# Patient Record
Sex: Female | Born: 1985 | Race: White | Hispanic: No | Marital: Married | Smoking: Former smoker
Health system: Southern US, Community
[De-identification: ages and names within clinical notes are randomized; demographics above are authoritative.]

## PROBLEM LIST (undated history)

## (undated) DIAGNOSIS — K589 Irritable bowel syndrome without diarrhea: Secondary | ICD-10-CM

## (undated) DIAGNOSIS — F419 Anxiety disorder, unspecified: Secondary | ICD-10-CM

## (undated) DIAGNOSIS — F32A Depression, unspecified: Secondary | ICD-10-CM

## (undated) DIAGNOSIS — F329 Major depressive disorder, single episode, unspecified: Secondary | ICD-10-CM

## (undated) HISTORY — PX: DILATION AND CURETTAGE OF UTERUS: SHX78

## (undated) HISTORY — DX: Anxiety disorder, unspecified: F41.9

## (undated) HISTORY — DX: Irritable bowel syndrome, unspecified: K58.9

## (undated) HISTORY — PX: WISDOM TOOTH EXTRACTION: SHX21

## (undated) HISTORY — DX: Depression, unspecified: F32.A

## (undated) HISTORY — DX: Major depressive disorder, single episode, unspecified: F32.9

---
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
## 2006-01-16 ENCOUNTER — Emergency Department (HOSPITAL_COMMUNITY): Admission: EM | Admit: 2006-01-16 | Discharge: 2006-01-16 | Payer: Self-pay | Admitting: Emergency Medicine
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
## 2006-07-24 ENCOUNTER — Other Ambulatory Visit: Admission: RE | Admit: 2006-07-24 | Discharge: 2006-07-24 | Payer: Self-pay | Admitting: Family Medicine

## 2007-02-13 ENCOUNTER — Other Ambulatory Visit: Admission: RE | Admit: 2007-02-13 | Discharge: 2007-02-13 | Payer: Self-pay | Admitting: Family Medicine

## 2007-02-26 ENCOUNTER — Ambulatory Visit (HOSPITAL_COMMUNITY): Admission: RE | Admit: 2007-02-26 | Discharge: 2007-02-26 | Payer: Self-pay | Admitting: *Deleted

## 2007-02-26 IMAGING — US US OB TRANSVAGINAL MODIFY
1 series · 13 of 28 positions shown · non-contrast
Comparison: none

CLINICAL DATA: 20 year old with positive pregnancy test, bleeding.
OBSTETRICAL ULTRASOUND <14 WKS AND TRANSVAGINAL OB ULTRASOUND ? [DATE]:
TECHNIQUE: Both transabdominal and transvaginal ultrasound examinations were performed for complete evaluation of the gestation as well as the maternal uterus, adnexal regions, and pelvic cul-de-sac.

[Series 1: unknown · 0.23mm/px · 13 of 92 slices shown]
[im 4/92]
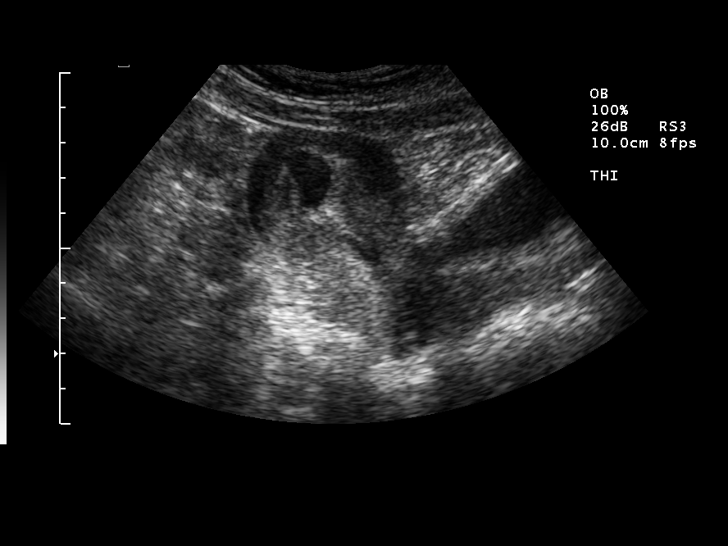
[im 11/92]
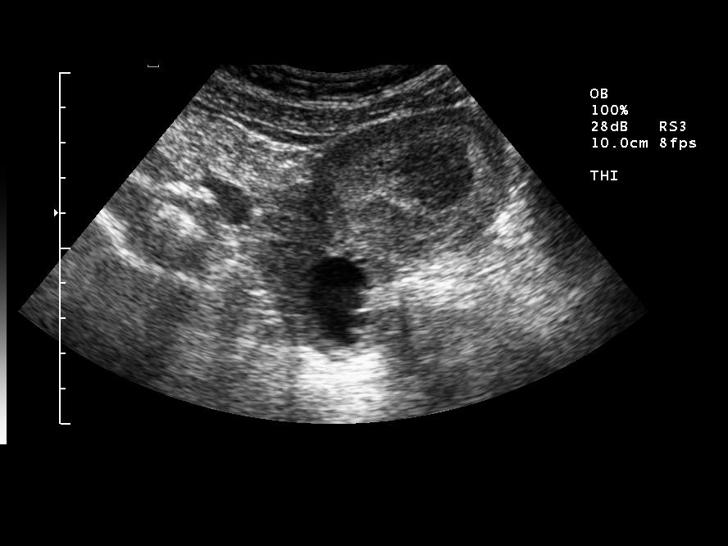
[im 17/92]
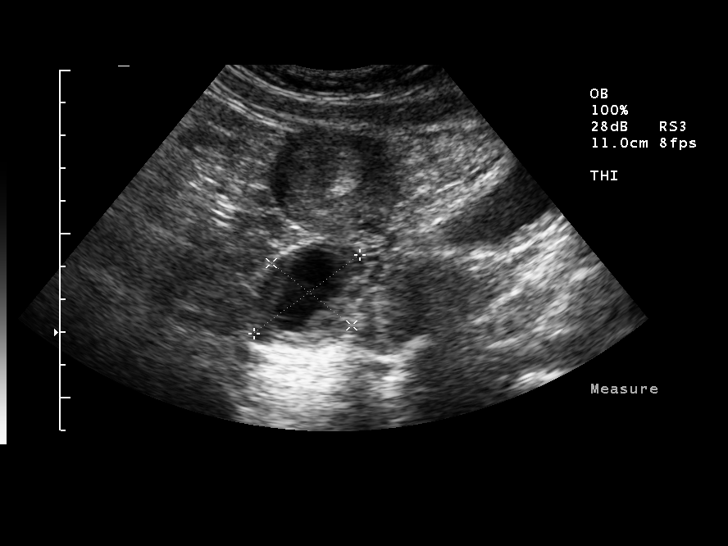
[im 24/92]
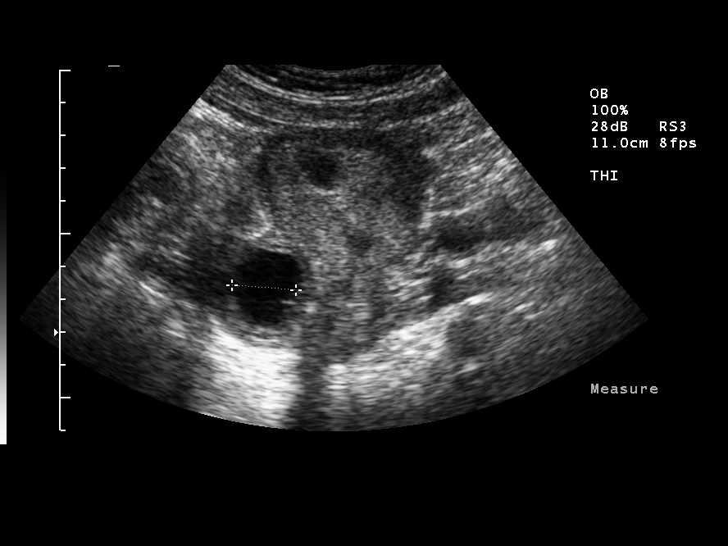
[im 31/92]
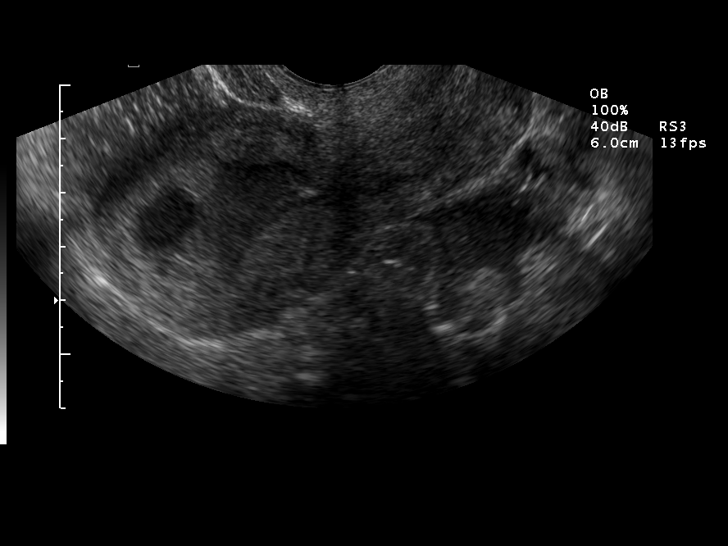
[im 38/92]
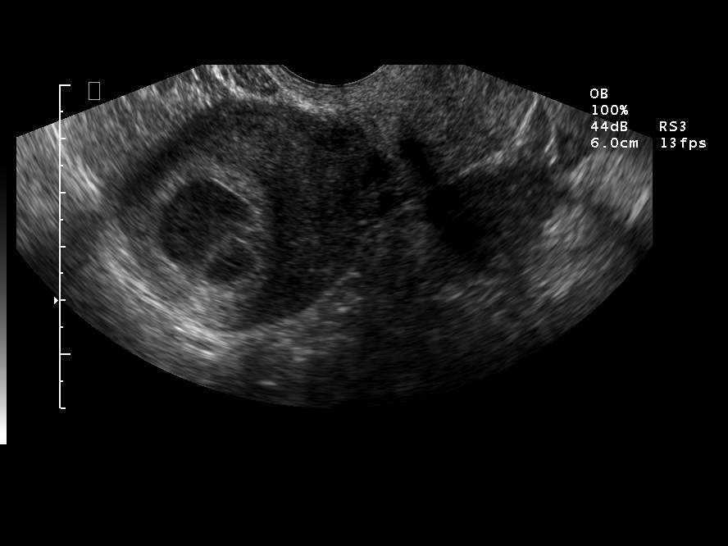
[im 48/92]
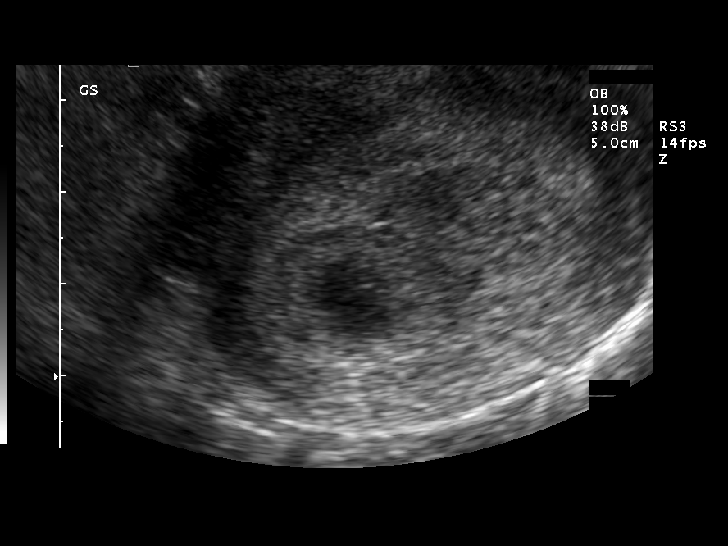
[im 54/92]
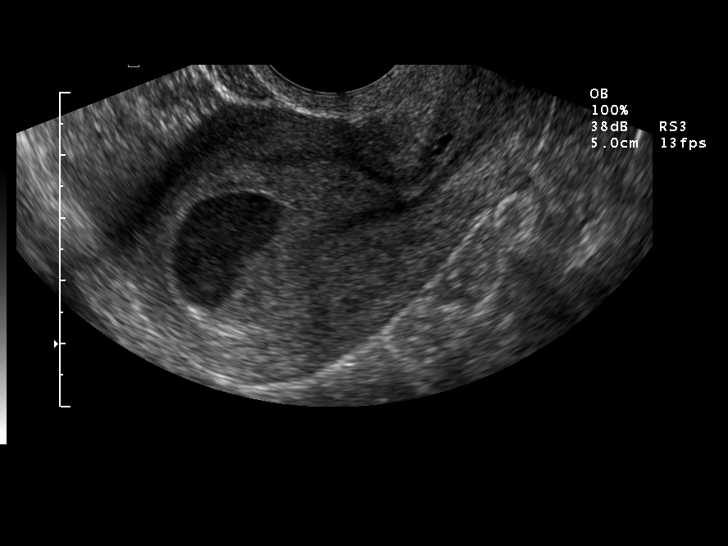
[im 61/92]
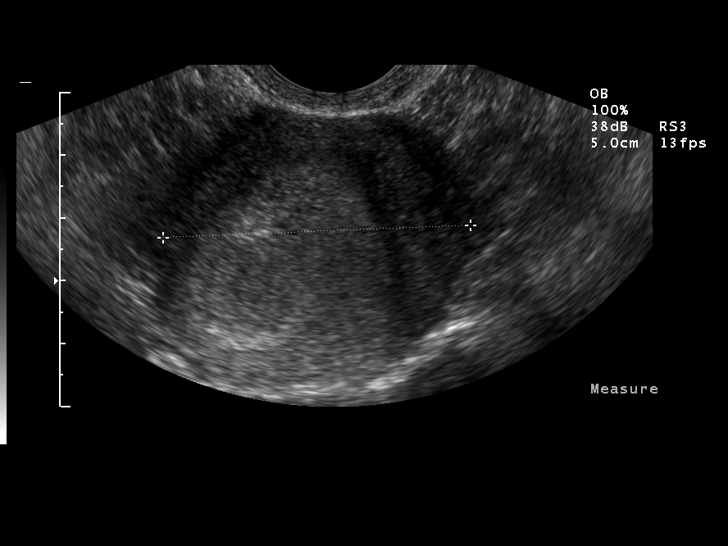
[im 68/92]
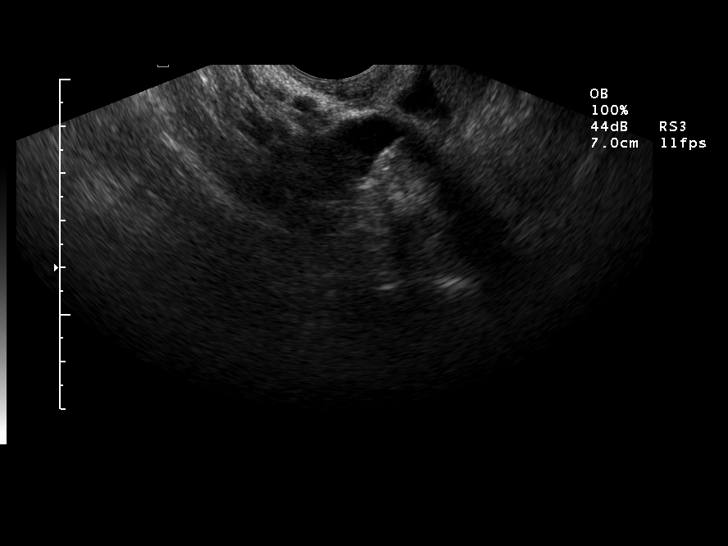
[im 75/92]
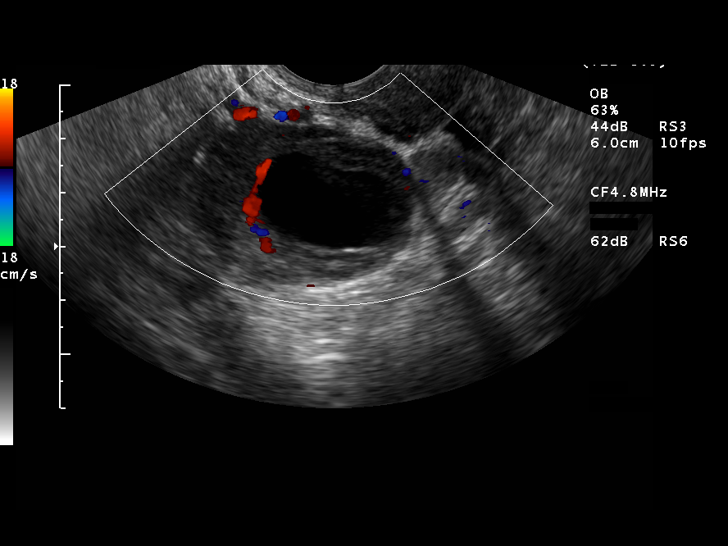
[im 81/92]
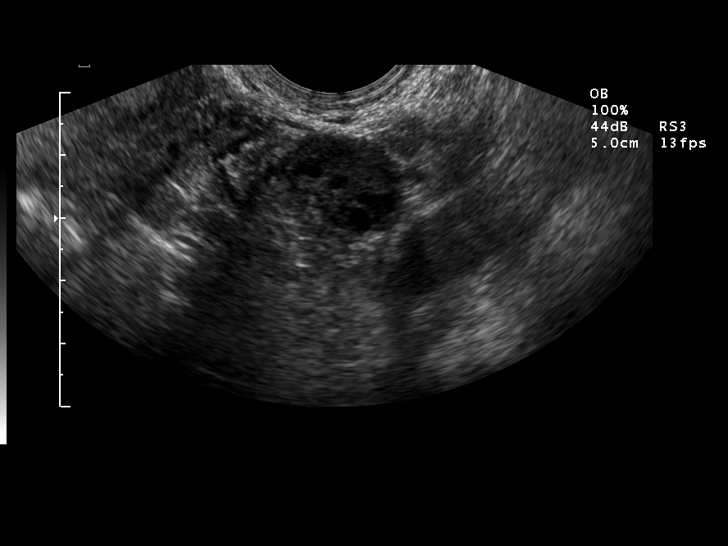
[im 88/92]
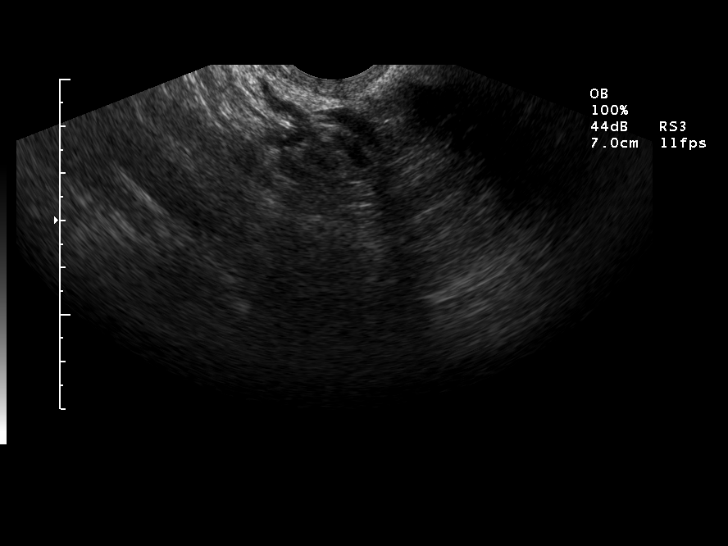

[13 of 28 positions shown; findings below may reference images not displayed]

FINDINGS: Within the uterus, there is a small saclike structure which does appear to contain a yolk sac.  This sac measures 6.6 mm, corresponding to an age of 5 weeks 3 days.  Adjacent to this sac, there is another rounded fluid collection which may represent a subchorionic hemorrhage though it is difficult to entirely exclude a second gestational sac.  I would suggest a follow-up ultrasound in 1-2 weeks to determine interval growth and document presence of embryonic pole with cardiac activity.  The right ovary contains a 2.8 x 1.6 x 1.6 cm corpus luteum cyst.  The left ovary has a normal appearance. There is a small amount of free pelvic fluid.
IMPRESSION: Intrauterine gestational sac with probable subchorionic hemorrhage.  It is not possible to entirely exclude two gestational sacs at this time and I would suggest close follow-up to document the presence of embryonic pole with heart rate.

## 2007-04-18 ENCOUNTER — Encounter (INDEPENDENT_AMBULATORY_CARE_PROVIDER_SITE_OTHER): Payer: Self-pay | Admitting: Obstetrics and Gynecology

## 2007-04-18 ENCOUNTER — Ambulatory Visit (HOSPITAL_COMMUNITY): Admission: RE | Admit: 2007-04-18 | Discharge: 2007-04-18 | Payer: Self-pay | Admitting: Obstetrics and Gynecology

## 2007-05-02 ENCOUNTER — Ambulatory Visit (HOSPITAL_COMMUNITY): Admission: AD | Admit: 2007-05-02 | Discharge: 2007-05-02 | Payer: Self-pay | Admitting: Obstetrics and Gynecology

## 2007-05-02 ENCOUNTER — Encounter (INDEPENDENT_AMBULATORY_CARE_PROVIDER_SITE_OTHER): Payer: Self-pay | Admitting: Obstetrics and Gynecology

## 2008-11-25 ENCOUNTER — Other Ambulatory Visit: Admission: RE | Admit: 2008-11-25 | Discharge: 2008-11-25 | Payer: Self-pay | Admitting: Family Medicine

## 2009-06-24 ENCOUNTER — Ambulatory Visit (HOSPITAL_COMMUNITY): Admission: RE | Admit: 2009-06-24 | Discharge: 2009-06-24 | Payer: Self-pay | Admitting: Obstetrics and Gynecology

## 2009-06-24 ENCOUNTER — Encounter (INDEPENDENT_AMBULATORY_CARE_PROVIDER_SITE_OTHER): Payer: Self-pay | Admitting: Obstetrics and Gynecology

## 2010-12-31 LAB — CBC
HCT: 41 % (ref 36.0–46.0)
MCHC: 34 g/dL (ref 30.0–36.0)
MCV: 88.5 fL (ref 78.0–100.0)
RDW: 13.2 % (ref 11.5–15.5)
WBC: 6.9 10*3/uL (ref 4.0–10.5)

## 2010-12-31 LAB — ABO/RH: ABO/RH(D): O POS

## 2011-02-08 NOTE — H&P (Signed)
Leslie Small, Leslie Small           ACCOUNT NO.:  [id number]   MEDICAL RECORD NO.:  [id number]          PATIENT TYPE:  AMB   LOCATION:  [facility]                           [facility]ILITY[facility]  [facility]   PHYSICIAN:  Charles A. Delcambre, MDDATE OF BIRTH:  1986-08-28   DATE OF ADMISSION:  DATE OF DISCHARGE:                              HISTORY & PHYSICAL   CHIEF COMPLAINT:  Missed abortion, 8 weeks and 5 days with fetal demise.   HISTORY OF PRESENT ILLNESS:  A 25 year old gravida 1, para 0-0-0-0, [facility]  October 31, 2007, with viable ultrasound noted on March 19, 2007, at 7  weeks and 5 days.  Fetal heart rate was 170.  She comes in today  complaining of cramping for one week.  Upon my recommendation, she  should be 12 weeks today, and I did not get a fetal heart rate with the  Doppler.  She underwent transvaginal ultrasound which showed a fetal  demise at 8 weeks and 5 days.  She was given options of waiting one more  week for spontaneous miscarriage versus Cytotec induced miscarriage  versus suction dilation curettage, dilation evacuation for termination  of the obvious inevitable abortion.  She elected this route for the D&E  and accepted risk of infection, bleeding, uterine perforation, retained  products, second D&C, blood products including hepatitis and HIV, bowel  and bladder damage.  All questions were answered.  She gives informed  consent.  Blood type is O positive   PAST MEDICAL HISTORY:  Depression, suicide attempt as a child.  Started  on Zoloft by me approximately two weeks ago.   MEDICATIONS:  1. Prenatal vitamins.  2. Zoloft 50 mg per day.   PAST SURGICAL HISTORY:  Oral surgery.   ALLERGIES:  No known drug allergies.   SOCIAL HISTORY:  No tobacco, ethanol or drug use.  She is monogamous  with her boyfriend.  No alcohol use.   FAMILY HISTORY:  Maternal thyroid disease, paternal grandfather chronic  hypertension.  Paternal grandmother breast cancer.  Maternal grandfather  lung cancer.   Otherwise, no major illnesses.   REVIEW OF SYSTEMS:  She does have cramping.  No vaginal bleeding.  No  shortness of breath, wheezing, diarrhea, constipation, bleeding per  rectum.  Headache, nausea or vomiting.   PHYSICAL EXAMINATION:  GENERAL:  Alert and oriented x3.  VITAL SIGNS:  Weight 136 pounds, blood pressure 100/60, respirations 18,  pulse 80.  HEENT:  Grossly within normal limits.  NECK:  Supple without thyromegaly or adenopathy.  LUNGS:  Clear bilaterally.  HEART:  Regular rate and rhythm without murmur.  BREASTS:  No masses, tenderness, discharge, skin or nipple changes  bilaterally.  ABDOMEN:  Flat without tenderness.  No hepatosplenomegaly.  PELVIC:  Normal external female genitalia.  She did complain of vaginal  discharge.  Wet prep was done today.  Cervix by digital exam is closed .  Uterus is small and anteverted consistent with 8 weeks size.  Ultrasound  is noted above.  Otherwise, adnexa with ovaries nontender and palpable  bilaterally.  Uterus was nontender.  RECTAL:  Not done.  Anus, peroneal body appear  normal.   ASSESSMENT:  Intrauterine pregnancy fetal demise at 8 weeks 5 days.  Missed AB at 12 weeks now for dilation evacuation.   PLAN:  N.p.o. past midnight.  I will have this on scheduled for tomorrow  at 10:45.  All questions were answered.  She gives informed consent.  Preop CBC.  We will plan to proceed as outlined.      Charles A. Sydnee Cabal, MD  Electronically Signed     CAD/MEDQ  D:  04/17/2007  T:  04/17/2007  Job:  [id number]

## 2011-02-08 NOTE — Op Note (Signed)
[name], Leslie Small           ACCOUNT NO.:  [id number]   MEDICAL RECORD NO.:  [id number]          PATIENT TYPE:  AMB   LOCATION:  [facility]                           [facility]  [facility]   PHYSICIAN:  Charles A. Delcambre, MDDATE OF BIRTH:  1985/10/03   DATE OF PROCEDURE:  04/18/2007  DATE OF DISCHARGE:                               OPERATIVE REPORT   PREOPERATIVE DIAGNOSIS:  Eight-week, five-week fetal demise, twelve-week  pregnancy, missed abortion.   POSTOPERATIVE DIAGNOSIS:  Eight-week, five-week fetal demise, twelve-  week pregnancy, missed abortion.   PROCEDURE PERFORMED:  1. Dilation and evacuation.  2. Paracervical block.   SURGEON:  Charles A. Delcambre, MD   ASSISTANT:  None.   COMPLICATIONS:  None.   ESTIMATED BLOOD LOSS:  Approximately 50 mL.   ANESTHESIA:  Monitored anesthesia care with intravenous sedation.   FINDINGS:  An eight-week pregnancy, products of conception large amount  to pathology.   COUNTS:  Sponge and needle count correct x2.   DESCRIPTION OF PROCEDURE:  The patient was taken to the operating room  and placed in the supine position.  She was sedated and placed in the  dorsal lithotomy position.  Sterile prep and drape was undertaken.  Tenaculum was used on the anterior cervix, weighted speculum in the  vagina.  Paracervical block with 0.25% plain Marcaine, 19 mL  approximately divided equally at 4 o'clock and 8 o'clock.  There was no  evidence of intravascular injection.  Hagar dilators up to Hagar 27 were  used to dilate the cervix.  There was no evidence of perforation.  A 9-  mm suction curette at 50 was used to accomplish three aspirations with a  curved curette, a moderate amount of tissue each time, fourth pass with  no tissue.  There was no evidence of perforation.  A Banjo curette was  used to circumferentially curette the cavity.  No further tissue was  obtained.  Final pass with the suction curette was done.  No evidence of  perforation.   No further tissue noted.  She was given one dose of  Methergine 0.2 mg IM to help with  bleeding with excellent response and 20 units of pitocin in the IV  fluid, approximately 600 mL.  She tolerated the procedure well.  Tenaculum site was not bleeding.  All instruments were removed.  She was  taken to recovery with anesthesia in attendance.      Charles A. Sydnee Cabal, MD  Electronically Signed     CAD/MEDQ  D:  04/18/2007  T:  04/18/2007  Job:  [id number]

## 2011-02-08 NOTE — Op Note (Signed)
Leslie Small, Leslie Small           ACCOUNT NO.:  [id number]   MEDICAL RECORD NO.:  [id number]          PATIENT TYPE:  AMB   LOCATION:  [facility]                          [facility]  [facility]   PHYSICIAN:  Charles A. Delcambre, MDDATE OF BIRTH:  Aug 29, 1986   DATE OF PROCEDURE:  05/02/2007  DATE OF DISCHARGE:                               OPERATIVE REPORT   PREOPERATIVE DIAGNOSIS:  Retained products of conception after dilation,  evacuation for an 8-week 5-day demise noted to be found as a missed AB  at 12 weeks approximately 2 weeks ago.   POSTOPERATIVE DIAGNOSIS:  Retained products of conception after  dilation, evacuation for an 8-week 5-day demise noted to be found as a  missed AB at 12 weeks approximately 2 weeks ago.   PROCEDURE:  1. Dilation and evacuation/dilate dilation and curettage.  2. Paracervical block.   SURGEON:  Charles A. Delcambre, MD   ASSISTANT:  None.   COMPLICATIONS:  None.   BLOOD LOSS:  50 mL.   SPECIMENS:  Products of conception moderate amount to pathology.   ANESTHESIA:  Monitored anesthesia care with IV sedation.   INSTRUMENT SPONGE NEEDLE COUNT:  Correct x2.   DESCRIPTION OF PROCEDURE:  The patient was taken to the operating room,  placed supine position and IV sedation was accomplished.  She was then  placed in dorsal lithotomy position in universal stirrups and sterilely  prepped and draped.  Cervix was viewed and some products were viewed  trying to come out as she had been cramping quite a bit today.  Ring  forceps were used to explore, sound was to 8 cm.  There was no evidence  of perforation during the entire case.  With ring forceps moderate  amount of tissue was removed and generalized curettage with the banjo  curette was done.  Small amount of tissue further with 50 cm mercury 8  mm suction curved curette was passed, the first time yielding a small  amount of tissue, second time yielding no further tissue.  She was once  again explored with  the polyp forceps and the banjo curette, one final  pass with the suction curette, no further tissue with any of these  passes and procedure was then terminated.  Tenaculum was removed.  I  should note that before the procedure began, paracervical block was  placed with 0.25% plain Marcaine divided equally at 4 and 8  o'clock.  There was no evidence of intravascular injection and to  prophylax against bleeding as she had been bleeding somewhat heavily  today, 800 mcg of Cytotec were placed per rectum.  The patient was  awaken and taken to recovery with physician in attendance and tolerated  procedure well.      Charles A. Sydnee Cabal, MD  Electronically Signed     CAD/MEDQ  D:  05/02/2007  T:  05/03/2007  Job:  [id number]

## 2011-07-11 LAB — CBC
HCT: 38.1
Hemoglobin: 12.1
Hemoglobin: 12.8
MCHC: 33.2
MCHC: 33.5
RBC: 4.44
RDW: 16.8 — ABNORMAL HIGH
WBC: 5.7

## 2014-08-13 ENCOUNTER — Encounter (HOSPITAL_BASED_OUTPATIENT_CLINIC_OR_DEPARTMENT_OTHER): Payer: Self-pay

## 2014-08-13 ENCOUNTER — Emergency Department (HOSPITAL_BASED_OUTPATIENT_CLINIC_OR_DEPARTMENT_OTHER): Payer: Self-pay

## 2014-08-13 ENCOUNTER — Emergency Department (HOSPITAL_BASED_OUTPATIENT_CLINIC_OR_DEPARTMENT_OTHER)
Admission: EM | Admit: 2014-08-13 | Discharge: 2014-08-13 | Disposition: A | Discharge status: Home / Self Care | Payer: Self-pay | Attending: Emergency Medicine | Admitting: Emergency Medicine

## 2014-08-13 DIAGNOSIS — M791 Myalgia: Secondary | ICD-10-CM | POA: Insufficient documentation

## 2014-08-13 DIAGNOSIS — R21 Rash and other nonspecific skin eruption: Secondary | ICD-10-CM | POA: Insufficient documentation

## 2014-08-13 DIAGNOSIS — Z720 Tobacco use: Secondary | ICD-10-CM | POA: Insufficient documentation

## 2014-08-13 DIAGNOSIS — R05 Cough: Secondary | ICD-10-CM

## 2014-08-13 DIAGNOSIS — J181 Lobar pneumonia, unspecified organism: Secondary | ICD-10-CM

## 2014-08-13 DIAGNOSIS — J189 Pneumonia, unspecified organism: Secondary | ICD-10-CM | POA: Insufficient documentation

## 2014-08-13 DIAGNOSIS — R059 Cough, unspecified: Secondary | ICD-10-CM

## 2014-08-13 DIAGNOSIS — R000 Tachycardia, unspecified: Secondary | ICD-10-CM | POA: Insufficient documentation

## 2014-08-13 IMAGING — CR DG CHEST 2V
2 series · 2 of 2 positions shown · non-contrast
Comparison: None.

CLINICAL DATA: Cough, chest congestion, and shortness of breath.

EXAM:
CHEST  2 VIEW

[w chest pa]
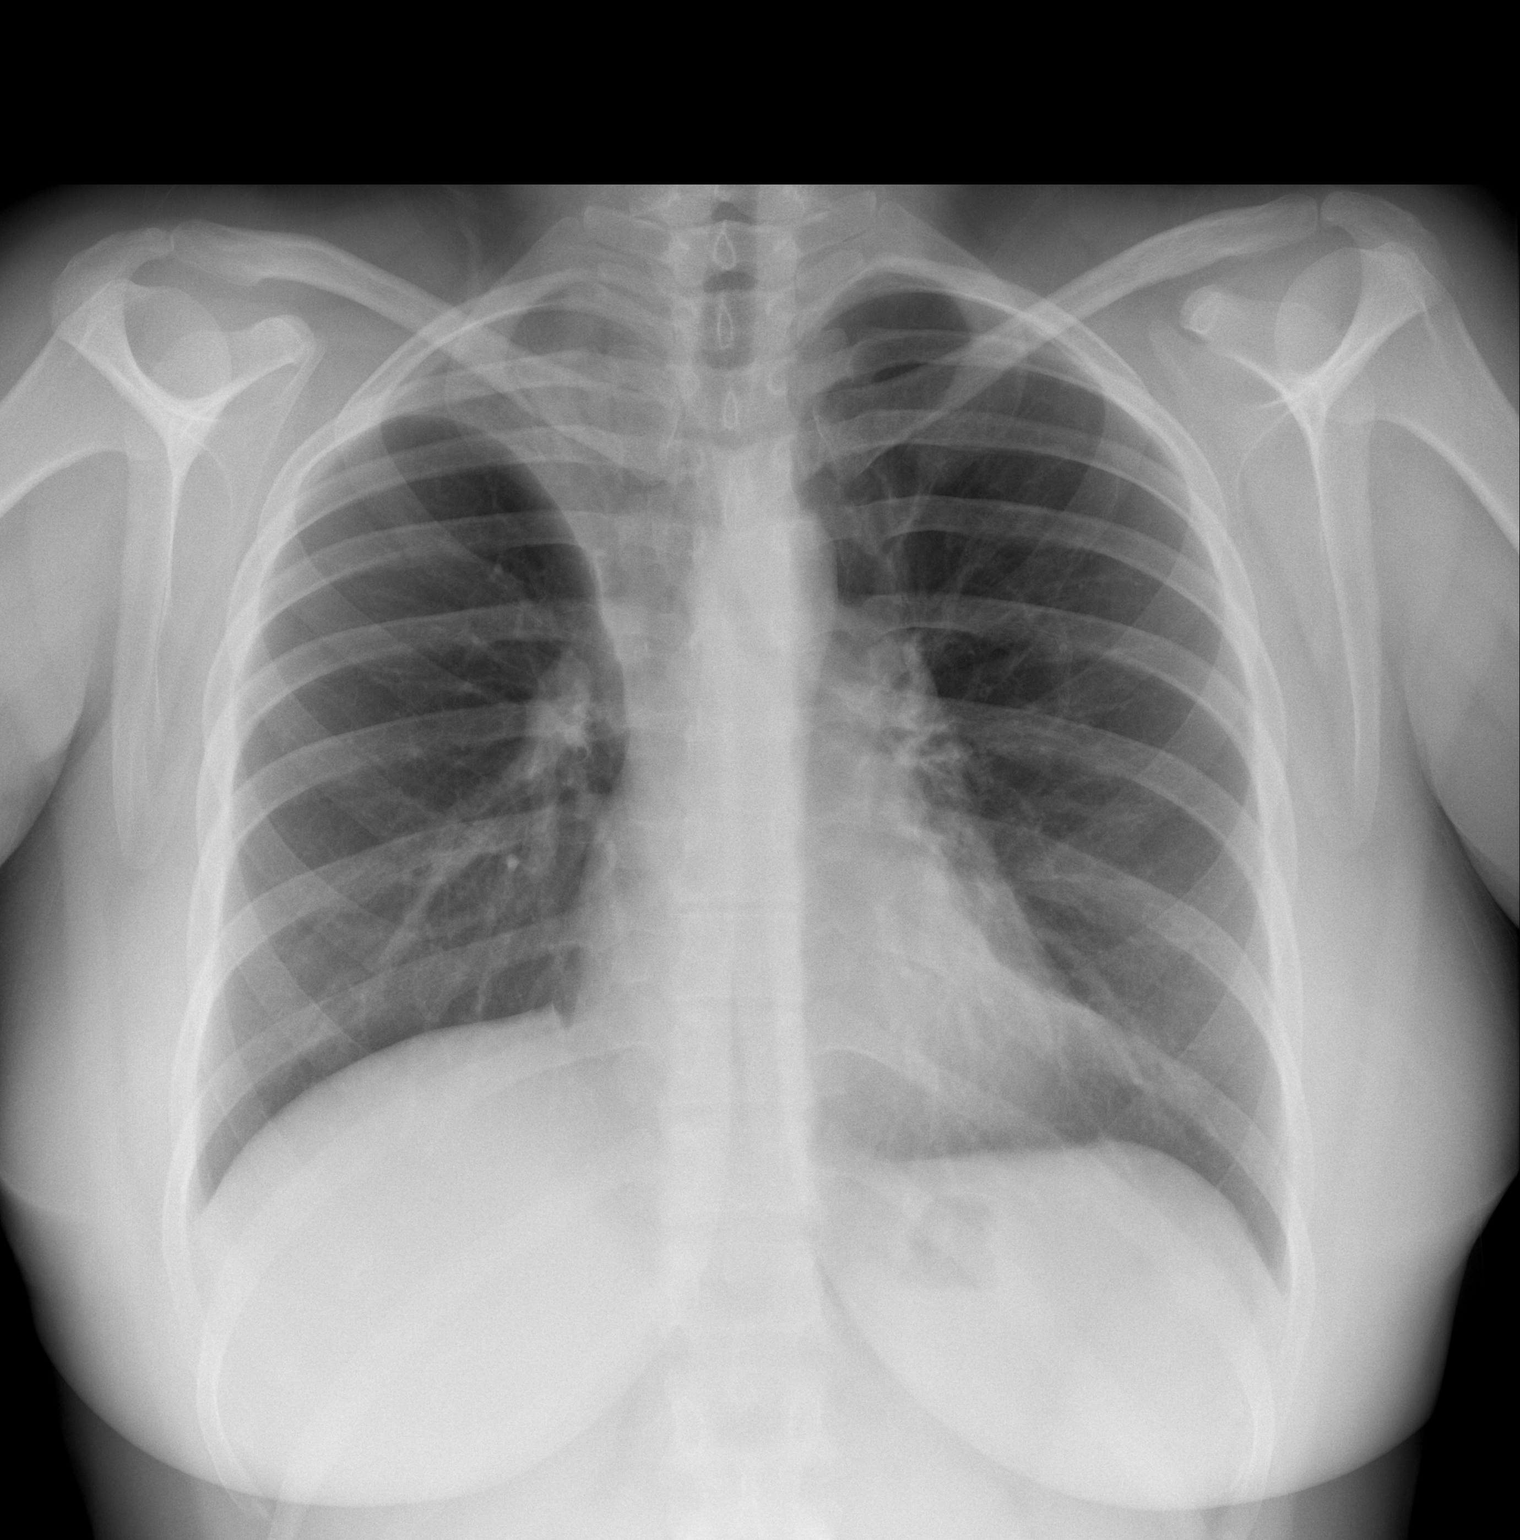

[w chest lat]
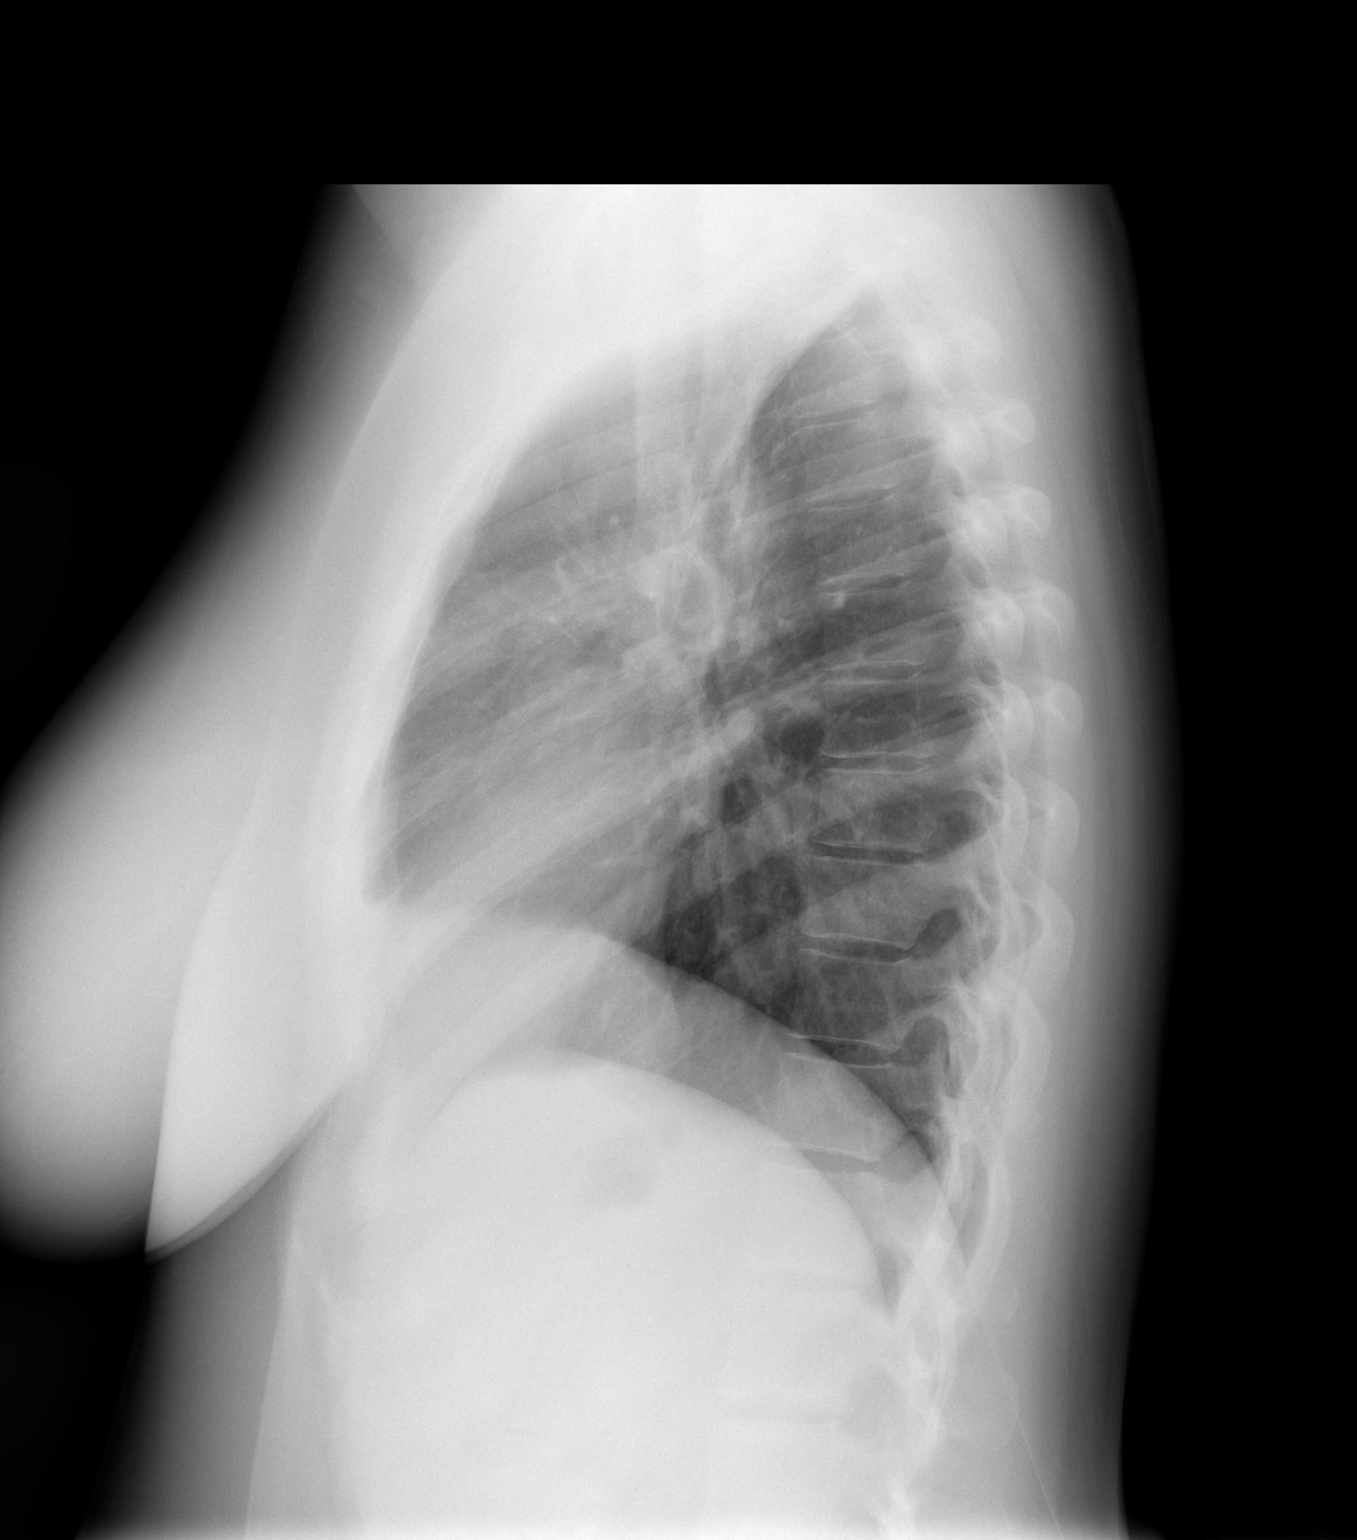

[2 of 2 positions shown; findings below may reference images not displayed]

FINDINGS: There is complete atelectasis of the right upper lobe. This is
consistent with bronchial obstruction. The rest of the right lung
appears normal. Left lung is clear. Heart size and vascularity are
normal. No effusions. No osseous abnormality.
IMPRESSION: Complete atelectasis of the right upper lobe suggesting right upper
lobe bronchial obstruction.

## 2014-08-13 MED ORDER — IPRATROPIUM-ALBUTEROL 0.5-2.5 (3) MG/3ML IN SOLN
3.0000 mL | RESPIRATORY_TRACT | Status: DC
  Administered 2014-08-13: 3 mL via RESPIRATORY_TRACT
  Filled 2014-08-13: qty 3

## 2014-08-13 MED ORDER — PREDNISONE 20 MG PO TABS
40.0000 mg | ORAL_TABLET | Freq: Once | ORAL | Status: AC
  Administered 2014-08-13: 40 mg via ORAL
  Filled 2014-08-13: qty 2

## 2014-08-13 MED ORDER — ALBUTEROL SULFATE HFA 108 (90 BASE) MCG/ACT IN AERS
2.0000 | INHALATION_SPRAY | RESPIRATORY_TRACT | Status: DC | PRN
  Administered 2014-08-13: 2 via RESPIRATORY_TRACT
  Filled 2014-08-13: qty 6.7

## 2014-08-13 MED ORDER — PREDNISONE 10 MG PO TABS: ORAL_TABLET | ORAL | Status: DC

## 2014-08-13 MED ORDER — AZITHROMYCIN 250 MG PO TABS: ORAL_TABLET | ORAL | Status: DC

## 2014-08-13 NOTE — ED Notes (Signed)
Patient taking OTC meds, Nyquil & tylenol, but no relief

## 2014-08-13 NOTE — ED Notes (Signed)
C/o head/chest congestion with prod cough x 3 days

## 2014-08-13 NOTE — Discharge Instructions (Signed)
Take robitussin for your cough in addition to the other medications. You will need to follow up in 2 weeks for recheck and then scheduled for a follow up x-ray. Return here sooner for worsening symptoms   Cool Mist Vaporizers Vaporizers may help relieve the symptoms of a cough and cold. They add moisture to the air, which helps mucus to become thinner and less sticky. This makes it easier to breathe and cough up secretions. Cool mist vaporizers do not cause serious burns like hot mist vaporizers, which may also be called steamers or humidifiers. Vaporizers have not been proven to help with colds. You should not use a vaporizer if you are allergic to mold. HOME CARE INSTRUCTIONS  Follow the package instructions for the vaporizer.  Do not use anything other than distilled water in the vaporizer.  Do not run the vaporizer all of the time. This can cause mold or bacteria to grow in the vaporizer.  Clean the vaporizer after each time it is used.  Clean and dry the vaporizer well before storing it.  Stop using the vaporizer if worsening respiratory symptoms develop. Document Released: 06/09/2004 Document Revised: 09/17/2013 Document Reviewed: 01/30/2013 [facility] Patient Information 2015 [employer], [place]. This information is not intended to replace advice given to you by your health care provider. Make sure you discuss any questions you have with your health care provider.

## 2014-08-13 NOTE — ED Provider Notes (Signed)
CSN: [id number]     Arrival date & time 08/13/14  1334 History   First MD Initiated Contact with Patient 08/13/14 1517     Chief Complaint  Patient presents with  . URI     (Consider location/radiation/quality/duration/timing/severity/associated sxs/prior Treatment) Patient is a 28 y.o. female presenting with URI. The history is provided by the patient.  URI Presenting symptoms: congestion, cough and sore throat   Presenting symptoms: no ear pain and no fever   Severity:  Moderate Onset quality:  Gradual Duration:  3 days Timing:  Constant Progression:  Worsening Chronicity:  New Relieved by:  Nothing Worsened by:  Nothing tried Ineffective treatments:  OTC medications and inhaler Associated symptoms: headaches, myalgias, sinus pain, swollen glands and wheezing    Leslie Small is a 28 y.o. female who presents to the ED with cough, cold and congestion that started 3 days ago. She is taking OTC medications without relief. She states the symptoms have gotten worse. She complains of feeling short of breath.   History reviewed. No pertinent past medical history. Past Surgical History  Procedure Laterality Date  . Dilation and curettage of uterus    . Wisdom tooth extraction     No family history on file. History  Substance Use Topics  . Smoking status: Current Every Day Smoker  . Smokeless tobacco: Not on file  . Alcohol Use: Yes   OB History    No data available     Review of Systems  Constitutional: Positive for chills. Negative for fever.  HENT: Positive for congestion, sinus pressure and sore throat. Negative for ear pain and trouble swallowing.   Eyes: Negative for visual disturbance.  Respiratory: Positive for cough and wheezing.   Gastrointestinal: Negative for nausea, vomiting, abdominal pain and diarrhea.  Genitourinary: Negative for dysuria, urgency and frequency.  Musculoskeletal: Positive for myalgias. Back pain: only with cough.  Skin: Positive for rash  (on chest).  Neurological: Positive for headaches. Negative for dizziness and syncope.  Psychiatric/Behavioral: Negative for confusion. The patient is not nervous/anxious.       Allergies  Review of patient's allergies indicates no known allergies.  Home Medications   Prior to Admission medications   Not on File   BP 121/76 mmHg  Pulse 107  Temp(Src) 98.4 F (36.9 C) (Oral)  Resp 16  Ht 5\' 2"  (1.575 m)  Wt 160 lb (72.576 kg)  BMI 29.26 kg/m2  SpO2 95%  LMP 07/30/2014 Physical Exam  Constitutional: She is oriented to person, place, and time. She appears well-developed and well-nourished.  HENT:  Head: Normocephalic and atraumatic.  Right Ear: Tympanic membrane normal.  Left Ear: Tympanic membrane normal.  Nose: Rhinorrhea present.  Mouth/Throat: Uvula is midline, oropharynx is clear and moist and mucous membranes are normal.  Eyes: EOM are normal. Pupils are equal, round, and reactive to light.  Neck: Normal range of motion. Neck supple.  Cardiovascular: Regular rhythm.  Tachycardia present.   Pulmonary/Chest: No respiratory distress. She has decreased breath sounds in the right upper field. She has wheezes in the right lower field, the left upper field and the left middle field. She has no rales.  Abdominal: Soft. There is no tenderness.  Musculoskeletal: Normal range of motion. She exhibits no edema.  Lymphadenopathy:    She has no cervical adenopathy.  Neurological: She is alert and oriented to person, place, and time. No cranial nerve deficit.  Skin: Skin is warm and dry.  Psychiatric: She has a normal mood and  affect. Her behavior is normal.  Nursing note and vitals reviewed.   ED Course  Procedures  Duoneb, CXR, Prednisone 40 mg PO Dg Chest 2 View  08/13/2014   CLINICAL DATA:  Cough, chest congestion, and shortness of breath.  EXAM: CHEST  2 VIEW  COMPARISON:  None.  FINDINGS: There is complete atelectasis of the right upper lobe. This is consistent with  bronchial obstruction. The rest of the right lung appears normal. Left lung is clear. Heart size and vascularity are normal. No effusions. No osseous abnormality.  IMPRESSION: Complete atelectasis of the right upper lobe suggesting right upper lobe bronchial obstruction.   Electronically Signed   By: Geanie CooleyJim  Maxwell M.D.   On: 08/13/2014 16:06   I discussed this case with Dr. Criss AlvineGoldston, will treat for pneumonia and have her follow up in 2 weeks.  MDM  28 y.o. female with cough and congestion that has gotten worse over the past 3 days. Stable for discharge with antibiotics, albuterol inhaler and cough medications. She is to follow up in 2 weeks. I discussed with the patient that the area is most likely pneumonia, however, we need to do a follow up chest x-ray to make sure it clears. She voices understanding and agrees with plan.    Medication List    TAKE these medications        azithromycin 250 MG tablet  Commonly known as:  ZITHROMAX Z-PAK  Take 2 tablets today and then one tablet PO daily     predniSONE 10 MG tablet  Commonly known as:  DELTASONE  Take 2 tablets PO BID           [name], NP 08/14/14 1831  Audree CamelScott T Goldston, MD 08/20/14 [phone]

## 2014-08-13 NOTE — ED Notes (Signed)
Patient transported to X-ray 

## 2015-01-27 ENCOUNTER — Emergency Department (HOSPITAL_BASED_OUTPATIENT_CLINIC_OR_DEPARTMENT_OTHER): Payer: Self-pay

## 2015-01-27 ENCOUNTER — Encounter (HOSPITAL_BASED_OUTPATIENT_CLINIC_OR_DEPARTMENT_OTHER): Payer: Self-pay | Admitting: Emergency Medicine

## 2015-01-27 ENCOUNTER — Emergency Department (HOSPITAL_BASED_OUTPATIENT_CLINIC_OR_DEPARTMENT_OTHER)
Admission: EM | Admit: 2015-01-27 | Discharge: 2015-01-27 | Disposition: A | Discharge status: Home / Self Care | Payer: Self-pay | Attending: Emergency Medicine | Admitting: Emergency Medicine

## 2015-01-27 DIAGNOSIS — J159 Unspecified bacterial pneumonia: Secondary | ICD-10-CM | POA: Insufficient documentation

## 2015-01-27 DIAGNOSIS — Z720 Tobacco use: Secondary | ICD-10-CM | POA: Insufficient documentation

## 2015-01-27 DIAGNOSIS — J189 Pneumonia, unspecified organism: Secondary | ICD-10-CM

## 2015-01-27 DIAGNOSIS — Z3202 Encounter for pregnancy test, result negative: Secondary | ICD-10-CM | POA: Insufficient documentation

## 2015-01-27 DIAGNOSIS — R000 Tachycardia, unspecified: Secondary | ICD-10-CM | POA: Insufficient documentation

## 2015-01-27 LAB — PREGNANCY, URINE: PREG TEST UR: NEGATIVE

## 2015-01-27 IMAGING — DX DG CHEST 2V
2 series · 2 of 2 positions shown · non-contrast
Comparison: [DATE]

CLINICAL DATA: Cough and congestion for 3 days

EXAM:
CHEST  2 VIEW

[chest pa]
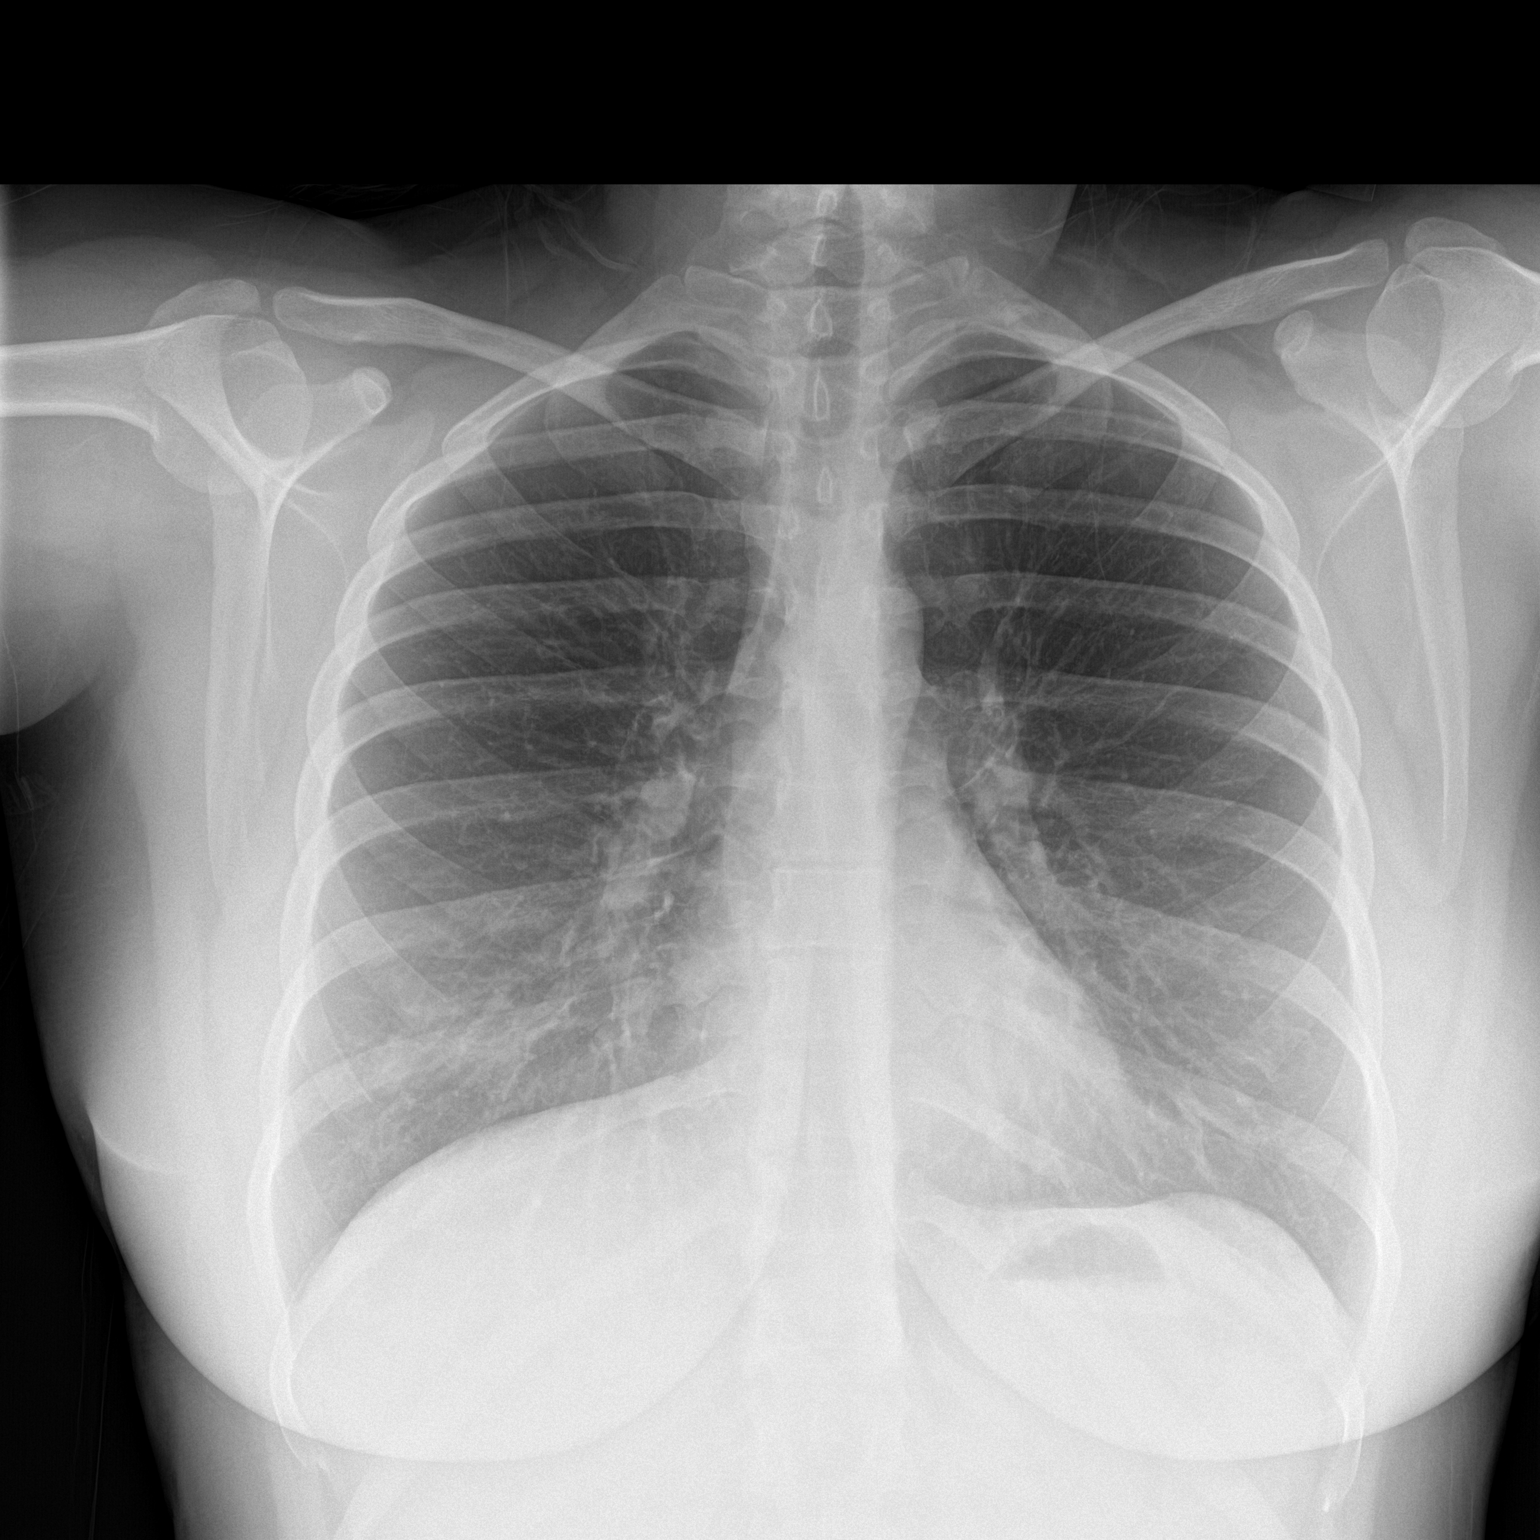

[chest lat]
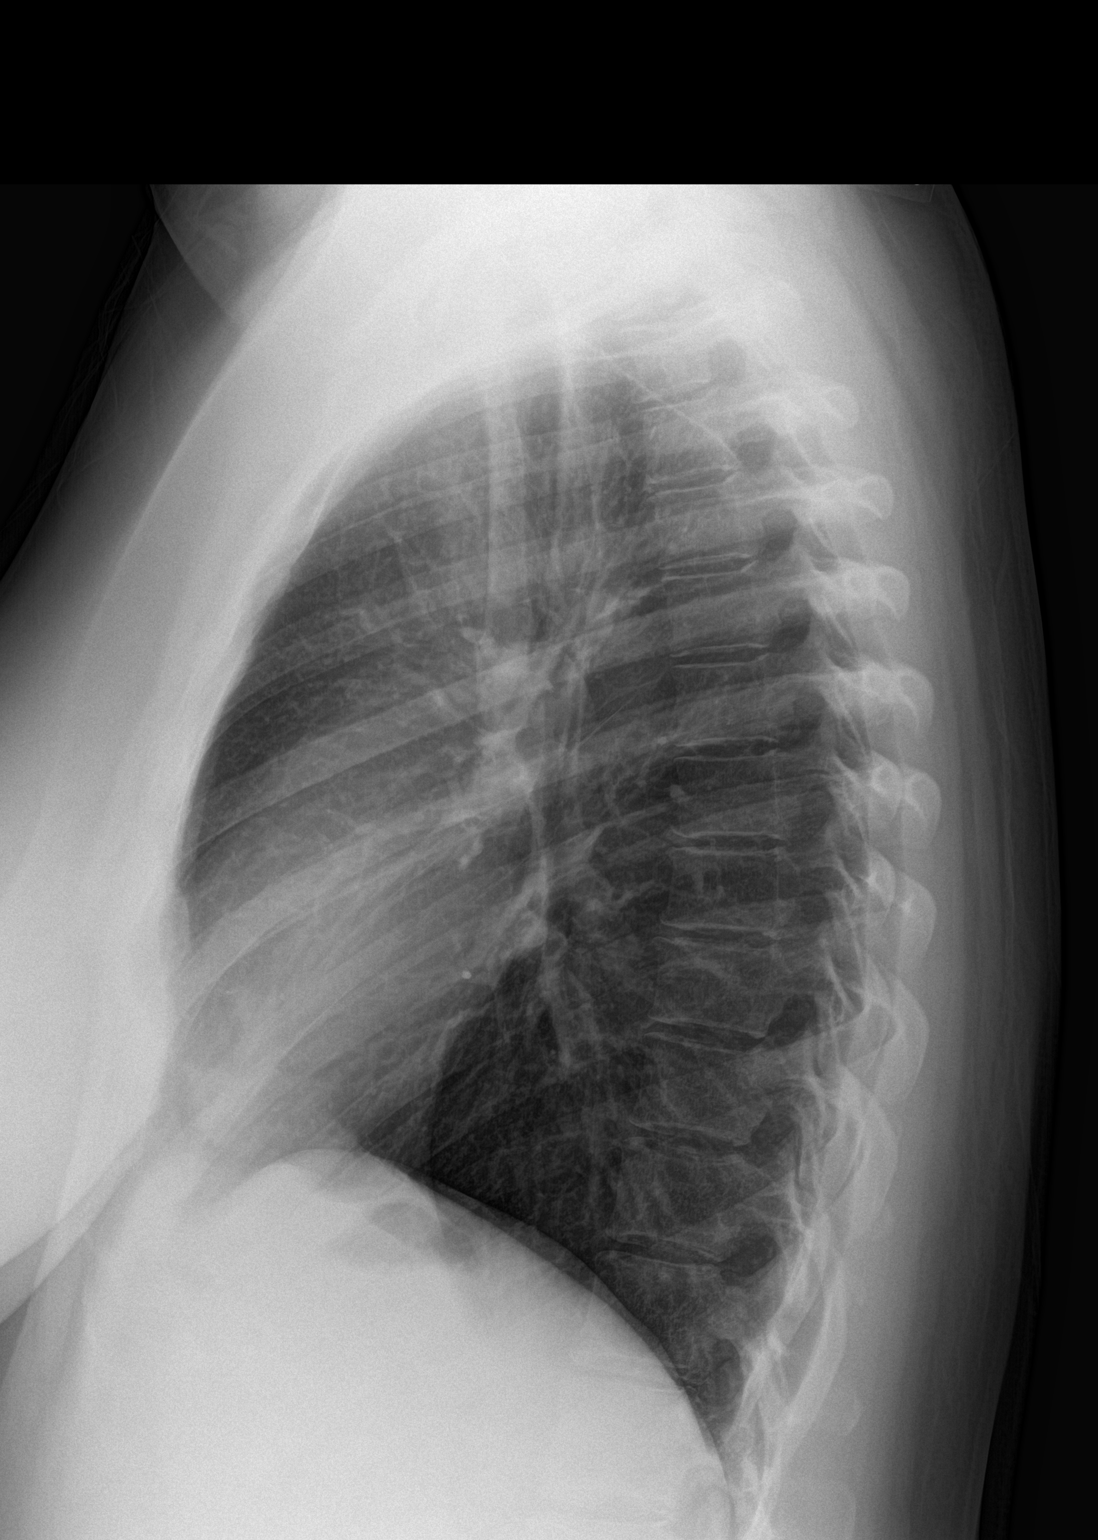

[2 of 2 positions shown; findings below may reference images not displayed]

FINDINGS: The previously seen right upper lobe collapse has resolved in the
interval. Minimal infiltrate is noted in the lateral aspect of the
right middle lobe. No bony abnormality is noted.
IMPRESSION: Mild right middle lobe infiltrate.

Resolution of previously seen right upper lobe collapse.

## 2015-01-27 MED ORDER — LEVOFLOXACIN 750 MG PO TABS: 750.0000 mg | ORAL_TABLET | Freq: Every day | ORAL | Status: DC

## 2015-01-27 NOTE — Discharge Instructions (Signed)
Take Levaquin daily as prescribed. Use your albuterol inhaler every 4-6 hours at home as needed for cough and wheezing. Follow-up with the wellness clinic to establish care with a primary care physician. I highly encourage you to stop smoking.  Pneumonia Pneumonia is an infection of the lungs.  CAUSES Pneumonia may be caused by bacteria or a virus. Usually, these infections are caused by breathing infectious particles into the lungs (respiratory tract). SIGNS AND SYMPTOMS   Cough.  Fever.  Chest pain.  Increased rate of breathing.  Wheezing.  Mucus production. DIAGNOSIS  If you have the common symptoms of pneumonia, your health care provider will typically confirm the diagnosis with a chest X-ray. The X-ray will show an abnormality in the lung (pulmonary infiltrate) if you have pneumonia. Other tests of your blood, urine, or sputum may be done to find the specific cause of your pneumonia. Your health care provider may also do tests (blood gases or pulse oximetry) to see how well your lungs are working. TREATMENT  Some forms of pneumonia may be spread to other people when you cough or sneeze. You may be asked to wear a mask before and during your exam. Pneumonia that is caused by bacteria is treated with antibiotic medicine. Pneumonia that is caused by the influenza virus may be treated with an antiviral medicine. Most other viral infections must run their course. These infections will not respond to antibiotics.  HOME CARE INSTRUCTIONS   Cough suppressants may be used if you are losing too much rest. However, coughing protects you by clearing your lungs. You should avoid using cough suppressants if you can.  Your health care provider may have prescribed medicine if he or she thinks your pneumonia is caused by bacteria or influenza. Finish your medicine even if you start to feel better.  Your health care provider may also prescribe an expectorant. This loosens the mucus to be coughed  up.  Take medicines only as directed by your health care provider.  Do not smoke. Smoking is a common cause of bronchitis and can contribute to pneumonia. If you are a smoker and continue to smoke, your cough may last several weeks after your pneumonia has cleared.  A cold steam vaporizer or humidifier in your room or home may help loosen mucus.  Coughing is often worse at night. Sleeping in a semi-upright position in a recliner or using a couple pillows under your head will help with this.  Get rest as you feel it is needed. Your body will usually let you know when you need to rest. PREVENTION A pneumococcal shot (vaccine) is available to prevent a common bacterial cause of pneumonia. This is usually suggested for:  People over 28 years old.  Patients on chemotherapy.  People with chronic lung problems, such as bronchitis or emphysema.  People with immune system problems. If you are over 65 or have a high risk condition, you may receive the pneumococcal vaccine if you have not received it before. In some countries, a routine influenza vaccine is also recommended. This vaccine can help prevent some cases of pneumonia.You may be offered the influenza vaccine as part of your care. If you smoke, it is time to quit. You may receive instructions on how to stop smoking. Your health care provider can provide medicines and counseling to help you quit. SEEK MEDICAL CARE IF: You have a fever. SEEK IMMEDIATE MEDICAL CARE IF:   Your illness becomes worse. This is especially true if you are elderly or  weakened from any other disease.  You cannot control your cough with suppressants and are losing sleep.  You begin coughing up blood.  You develop pain which is getting worse or is uncontrolled with medicines.  Any of the symptoms which initially brought you in for treatment are getting worse rather than better.  You develop shortness of breath or chest pain. MAKE SURE YOU:   Understand  these instructions.  Will watch your condition.  Will get help right away if you are not doing well or get worse. Document Released: 09/12/2005 Document Revised: 01/27/2014 Document Reviewed: 12/02/2010 [facility] Patient Information 2015 [place], [place]. This information is not intended to replace advice given to you by your health care provider. Make sure you discuss any questions you have with your health care provider.

## 2015-01-27 NOTE — ED Provider Notes (Signed)
CSN: [id number]     Arrival date & time 01/27/15  1502 History   First MD Initiated Contact with Patient 01/27/15 1556     Chief Complaint  Patient presents with  . URI     (Consider location/radiation/quality/duration/timing/severity/associated sxs/prior Treatment) HPI Comments: 29 year old female complaining of nasal congestion and productive cough with green mucus 3 days. Initially she felt a lot of sinus sinus congestion followed by chest congestion. She has been using her albuterol inhaler with some relief of cough. Denies wheezing or shortness of breath. Denies fevers. She is a daily smoker. Had pneumonia in November 2015 and was treated with azithromycin. No recent antibiotic use.  Patient is a 29 y.o. female presenting with URI. The history is provided by the patient.  URI Presenting symptoms: congestion, cough and rhinorrhea     History reviewed. No pertinent past medical history. Past Surgical History  Procedure Laterality Date  . Dilation and curettage of uterus    . Wisdom tooth extraction     No family history on file. History  Substance Use Topics  . Smoking status: Current Every Day Smoker -- 0.25 packs/day    Types: Cigarettes  . Smokeless tobacco: Not on file  . Alcohol Use: Yes   OB History    No data available     Review of Systems  HENT: Positive for congestion, rhinorrhea and sinus pressure.   Respiratory: Positive for cough.   All other systems reviewed and are negative.     Allergies  Review of patient's allergies indicates no known allergies.  Home Medications   Prior to Admission medications   Medication Sig Start Date End Date Taking? Authorizing Provider  levofloxacin (LEVAQUIN) 750 MG tablet Take 1 tablet (750 mg total) by mouth daily. X 7 days 01/27/15   Kathrynn Speed, PA-C   BP 113/73 mmHg  Pulse 111  Temp(Src) 98.5 F (36.9 C) (Oral)  Resp 18  Ht  (1.575 m)  Wt 175 lb (79.379 kg)  BMI 32.00 kg/m2  SpO2 96%  LMP  01/27/2015 Physical Exam  Constitutional: She is oriented to person, place, and time. She appears well-developed and well-nourished. No distress.  HENT:  Head: Normocephalic and atraumatic.  Mouth/Throat: Oropharynx is clear and moist.  Eyes: Conjunctivae and EOM are normal.  Neck: Normal range of motion. Neck supple.  Cardiovascular: Regular rhythm and normal heart sounds.   Tachycardic.  Pulmonary/Chest: Effort normal. No respiratory distress.  Diffuse rhonchi bilateral, more so on right.  Musculoskeletal: Normal range of motion. She exhibits no edema.  Neurological: She is alert and oriented to person, place, and time. No sensory deficit.  Skin: Skin is warm and dry.  Psychiatric: She has a normal mood and affect. Her behavior is normal.  Nursing note and vitals reviewed.   ED Course  Procedures (including critical care time) Labs Review Labs Reviewed  PREGNANCY, URINE    Imaging Review Dg Chest 2 View (if Patient Has Fever And/or Copd)  01/27/2015   CLINICAL DATA:  Cough and congestion for 3 days  EXAM: CHEST  2 VIEW  COMPARISON:  08/13/2014  FINDINGS: The previously seen right upper lobe collapse has resolved in the interval. Minimal infiltrate is noted in the lateral aspect of the right middle lobe. No bony abnormality is noted.  IMPRESSION: Mild right middle lobe infiltrate.  Resolution of previously seen right upper lobe collapse.   Electronically Signed   By: Alcide Clever M.D.   On: 01/27/2015 15:38  EKG Interpretation None      MDM   Final diagnoses:  CAP (community acquired pneumonia)   Nontoxic appearing, NAD. Afebrile. Tachycardic, vitals otherwise stable. O2 sat 96% on room air. No respiratory distress. Chest x-ray confirming mild right middle lobe infiltrate. Will treat with Levaquin. Treated in November with azithromycin. She has albuterol inhaler at home. Stable for discharge. Resources given for follow-up. Highly encouraged smoking cessation. Return  precautions given. Patient states understanding of treatment care plan and is agreeable.  Kathrynn SpeedRobyn M Jamaar Howes, PA-C 01/27/15 1612  Rolan BuccoMelanie Belfi, MD 01/27/15 [phone]

## 2015-01-27 NOTE — ED Notes (Signed)
29 yo c/o cough and congestion x3 days. Reports green mucous from nose. Denies Fever, N/V/D ambulatory at triage.

## 2015-06-13 ENCOUNTER — Ambulatory Visit (INDEPENDENT_AMBULATORY_CARE_PROVIDER_SITE_OTHER): Payer: BLUE CROSS/BLUE SHIELD | Admitting: Physician Assistant

## 2015-06-13 VITALS — BP 104/68 | HR 100 | Temp 98.3°F | Resp 16 | Ht 64.0 in | Wt 183.8 lb

## 2015-06-13 DIAGNOSIS — L02419 Cutaneous abscess of limb, unspecified: Secondary | ICD-10-CM | POA: Diagnosis not present

## 2015-06-13 MED ORDER — DOXYCYCLINE HYCLATE 100 MG PO CAPS: 100.0000 mg | ORAL_CAPSULE | Freq: Two times a day (BID) | ORAL | Status: DC

## 2015-06-13 NOTE — Patient Instructions (Signed)
Please take the doxycycline twice daily for 10 days. Applying warm compresses to the area 3-4 times per day over the next few days will help. Please come back to see [place] if the area becomes very soft, the redness gets bigger, or you start having fevers.   Hidradenitis Suppurativa, Sweat Gland Abscess Hidradenitis suppurativa is a long lasting (chronic), uncommon disease of the sweat glands. With this, boil-like lumps and scarring develop in the groin, some times under the arms (axillae), and under the breasts. It may also uncommonly occur behind the ears, in the crease of the buttocks, and around the genitals.  CAUSES  The cause is from a blocking of the sweat glands. They then become infected. It may cause drainage and odor. It is not contagious. So it cannot be given to someone else. It most often shows up in puberty (about 33 to 28 years of age). But it may happen much later. It is similar to acne which is a disease of the sweat glands. This condition is slightly more common in African-Americans and women. SYMPTOMS   Hidradenitis usually starts as one or more red, tender, swellings in the groin or under the arms (axilla).  Over a period of hours to days the lesions get larger. They often open to the skin surface, draining clear to yellow-colored fluid.  The infected area heals with scarring. DIAGNOSIS  Your caregiver makes this diagnosis by looking at you. Sometimes cultures (growing germs on plates in the lab) may be taken. This is to see what germ (bacterium) is causing the infection.  TREATMENT   Topical germ killing medicine applied to the skin (antibiotics) are the treatment of choice. Antibiotics taken by mouth (systemic) are sometimes needed when the condition is getting worse or is severe.  Avoid tight-fitting clothing which traps moisture in.  Dirt does not cause hidradenitis and it is not caused by poor hygiene.  Involved areas should be cleaned daily using an antibacterial soap.  Some patients find that the liquid form of Lever 2000, applied to the involved areas as a lotion after bathing, can help reduce the odor related to this condition.  Sometimes surgery is needed to drain infected areas or remove scarred tissue. Removal of large amounts of tissue is used only in severe cases.  Birth control pills may be helpful.  Oral retinoids (vitamin A derivatives) for 6 to 12 months which are effective for acne may also help this condition.  Weight loss will improve but not cure hidradenitis. It is made worse by being overweight. But the condition is not caused by being overweight.  This condition is more common in people who have had acne.  It may become worse under stress. There is no medical cure for hidradenitis. It can be controlled, but not cured. The condition usually continues for years with periods of getting worse and getting better (remission). Document Released: 04/26/2004 Document Revised: 12/05/2011 Document Reviewed: 12/13/2013 [facility] Patient Information 2015 [place], [place]. This information is not intended to replace advice given to you by your health care Stalin Gruenberg. Make sure you discuss any questions you have with your health care Virgina Deakins.

## 2015-06-13 NOTE — Progress Notes (Signed)
   Subjective:    Patient ID: Leslie Small, female    DOB: 06-02-1986, 29 y.o.   MRN: [id number]  Chief Complaint  Patient presents with  . Abscess    to the left inner thigh x's 1 day   Medications, allergies, past medical history, surgical history, family history, social history and problem list reviewed and updated.  HPI  29 yof presents with possible abscess left inner thigh.   Sx started 2 days ago. Painful, red, swollen. No trauma. Denies fevers. No drainage. Has not applied anything to area. Had similar issue last year which resolved on own.   Review of Systems No chills, abd pain, diarrhea, vaginal dc, dysuria.     Objective:   Physical Exam  Constitutional: She is oriented to person, place, and time. She appears well-developed and well-nourished.  Non-toxic appearance. She does not have a sickly appearance. She does not appear ill. No distress.  BP 104/68 mmHg  Pulse 100  Temp(Src) 98.3 F (36.8 C) (Oral)  Resp 16  Ht  (1.626 m)  Wt 183 lb 12.8 oz (83.371 kg)  BMI 31.53 kg/m2  SpO2 99%  LMP 05/31/2015   Neurological: She is alert and oriented to person, place, and time.  Skin:  2cm x 2cm area induration left inner thigh. Surrounding erythema. No fluctuance. Prominent blackheads/pores bilateral groin area.        Assessment & Plan:   Abscess of leg - Plan: doxycycline (VIBRAMYCIN) 100 MG capsule --abscess left inner thigh not yet fluctuant --treat for now with doxy and warm compresses --rtc if becomes fluctuant, redness expands, fevers --information given on hidradenitis, possibly cause of recurrent abscesses  Donnajean Lopes, PA-C Physician Assistant-Certified [facility]  06/13/2015 12:30 PM

## 2015-07-31 ENCOUNTER — Ambulatory Visit (INDEPENDENT_AMBULATORY_CARE_PROVIDER_SITE_OTHER): Payer: BLUE CROSS/BLUE SHIELD | Admitting: Physician Assistant

## 2015-07-31 ENCOUNTER — Encounter: Payer: Self-pay | Admitting: Physician Assistant

## 2015-07-31 VITALS — BP 111/76 | HR 98 | Temp 98.5°F | Resp 16 | Ht 65.0 in | Wt 185.0 lb

## 2015-07-31 DIAGNOSIS — R109 Unspecified abdominal pain: Secondary | ICD-10-CM

## 2015-07-31 DIAGNOSIS — K591 Functional diarrhea: Secondary | ICD-10-CM | POA: Diagnosis not present

## 2015-07-31 DIAGNOSIS — K219 Gastro-esophageal reflux disease without esophagitis: Secondary | ICD-10-CM | POA: Diagnosis not present

## 2015-07-31 DIAGNOSIS — K30 Functional dyspepsia: Secondary | ICD-10-CM

## 2015-07-31 LAB — POCT GLYCOSYLATED HEMOGLOBIN (HGB A1C): Hemoglobin A1C: 4.9

## 2015-07-31 LAB — HEMOGLOBIN A1C: Hgb A1c MFr Bld: 4.9 % (ref 4.0–6.0)

## 2015-07-31 LAB — POCT SEDIMENTATION RATE: POCT SED RATE: 15 mm/h (ref 0–22)

## 2015-07-31 MED ORDER — ESOMEPRAZOLE MAGNESIUM 40 MG PO CPDR: 40.0000 mg | DELAYED_RELEASE_CAPSULE | Freq: Every day | ORAL | Status: DC

## 2015-07-31 MED ORDER — LOPERAMIDE HCL 2 MG PO CAPS: 2.0000 mg | ORAL_CAPSULE | ORAL | Status: DC

## 2015-07-31 NOTE — Patient Instructions (Signed)

## 2015-07-31 NOTE — Progress Notes (Signed)
07/31/2015 at 2:39 PM  [name] / DOB: 01-17-1986 / MRN: [id number]  The patient  does not have a problem list on file.  SUBJECTIVE  Leslie Small is a 29 y.o. female who complains of generalized belly pain and diarrhea that has been present for over 6 months now, and have been worsening over the last month.  She complains that if she eats "anything" she has to run to the bathroom due to belly pain, and will have an episode of diarrhea.  She reports the symptoms are worse with dairy foods, however here symptoms have occurred after eating nothing but Heid rice.  Reports she drinks one or two beers once a week and symptoms are not worse after that. Reports that her symptoms are typically worse after eating.     Complains of GERD symptoms that she describes as burning in her esophagus, also made worse by dairy but can happen with any foods as well.  Describes the pain as burning in her throat and sometimes has a sour taste in her mouth.  Denies fever, chills,   She  has no past medical history on file.    Medications reviewed and updated by myself where necessary, and exist elsewhere in the encounter.   Ms. Worden has No Known Allergies. She  reports that she has been smoking Cigarettes.  She has been smoking about 0.25 packs per day. She does not have any smokeless tobacco history on file. She reports that she drinks alcohol. She reports that she does not use illicit drugs. She  has no sexual activity history on file. The patient  has past surgical history that includes Dilation and curettage of uterus and Wisdom tooth extraction.  Her family history is not on file.  Review of Systems  Constitutional: Negative for fever and chills.  Respiratory: Negative for cough.   Cardiovascular: Negative for chest pain.  Gastrointestinal: Positive for nausea, abdominal pain and diarrhea. Negative for heartburn, vomiting, constipation, blood in stool and melena.  Genitourinary: Negative for dysuria.    Skin: Negative for itching and rash.  Neurological: Negative for dizziness and headaches.    OBJECTIVE  Her  height is  (1.651 m) and weight is 185 lb (83.915 kg). Her temperature is 98.5 F (36.9 C). Her blood pressure is 111/76 and her pulse is 98. Her respiration is 16.  The patient's body mass index is 30.79 kg/(m^2). Pulse rechecked by me at 72.    Physical Exam  Constitutional: She is oriented to person, place, and time. She appears well-developed and well-nourished. No distress.  HENT:  Right Ear: External ear normal.  Left Ear: External ear normal.  Nose: Nose normal.  Mouth/Throat: No oropharyngeal exudate.  Eyes: Conjunctivae and EOM are normal. Pupils are equal, round, and reactive to light.  Neck: Normal range of motion. No thyromegaly present.  Cardiovascular: Normal rate and regular rhythm.   Respiratory: Effort normal.  GI: Soft. Bowel sounds are normal. She exhibits no distension and no mass. There is no tenderness. There is no rigidity, no rebound, no guarding, no tenderness at McBurney's point and negative Murphy's sign.  Musculoskeletal: Normal range of motion.  Lymphadenopathy:    She has no cervical adenopathy.  Neurological: She is alert and oriented to person, place, and time. She has normal reflexes. No cranial nerve deficit. Coordination normal.  Skin: Skin is warm and dry. She is not diaphoretic.  Psychiatric: She has a normal mood and affect. Her behavior is normal.  Judgment and thought content normal.   Vitals - 1 value per visit 07/31/2015 06/13/2015 01/27/2015 08/13/2014  Weight (lb) 185 183.8 175 160   Results for orders placed or performed in visit on 07/31/15 (from the past 72 hour(s))  POCT glycosylated hemoglobin (Hb A1C)     Status: None   Collection Time: 07/31/15  2:48 PM  Result Value Ref Range   Hemoglobin A1C 4.9   POCT SEDIMENTATION RATE     Status: None   Collection Time: 07/31/15  3:51 PM  Result Value Ref Range   POCT SED RATE 15  0 - 22 mm/hr     No results found for this or any previous visit (from the past 24 hour(s)).  ASSESSMENT & PLAN  Maralyn SagoSarah was seen today for abdominal cramping and diarrhea.  Diagnoses and all orders for this visit:  Belly pain: DDx includes IBD, IBS,  gall bladder, and PUD.  Will try to narrow before sending to a specialist for eval, however will have low threshold.  If she does improve and her work up is normal will consider fiber supplementation.  BRAT diet for now while awaiting work up.  -     COMPLETE METABOLIC PANEL WITH GFR -     H. pylori breath test -     POCT glycosylated hemoglobin (Hb A1C)  Functional diarrhea: Non blood.  -     POCT SEDIMENTATION RATE -     esomeprazole (NEXIUM) 40 MG capsule; Take 1 capsule (40 mg total) by mouth daily. -     TSH  Gastroesophageal reflux disease, esophagitis presence not specified -     CBC with Differential/Platelet -     loperamide (IMODIUM) 2 MG capsule; Take 1 capsule (2 mg total) by mouth every 4 (four) hours. Take as needed.    The patient was advised to call or come back to clinic if she does not see an improvement in symptoms, or worsens with the above plan.   Deliah BostonMichael Yovanna Cogan, MHS, PA-C [facility] 07/31/2015 2:39 PM

## 2015-08-03 LAB — CBC WITH DIFFERENTIAL/PLATELET
BASOS ABS: 0 10*3/uL (ref 0.0–0.1)
Basophils Relative: 0 % (ref 0–1)
EOS PCT: 0 % (ref 0–5)
Eosinophils Absolute: 0 10*3/uL (ref 0.0–0.7)
HEMATOCRIT: 40.7 % (ref 36.0–46.0)
Hemoglobin: 13.6 g/dL (ref 12.0–15.0)
LYMPHS ABS: 1.8 10*3/uL (ref 0.7–4.0)
LYMPHS PCT: 28 % (ref 12–46)
MCH: 28.3 pg (ref 26.0–34.0)
MCHC: 33.4 g/dL (ref 30.0–36.0)
MCV: 84.6 fL (ref 78.0–100.0)
MONO ABS: 0.4 10*3/uL (ref 0.1–1.0)
MONOS PCT: 7 % (ref 3–12)
MPV: 11.7 fL (ref 8.6–12.4)
Neutro Abs: 4.1 10*3/uL (ref 1.7–7.7)
Neutrophils Relative %: 65 % (ref 43–77)
Platelets: 294 10*3/uL (ref 150–400)
RBC: 4.81 MIL/uL (ref 3.87–5.11)
RDW: 14 % (ref 11.5–15.5)
WBC: 6.3 10*3/uL (ref 4.0–10.5)

## 2015-08-03 LAB — COMPLETE METABOLIC PANEL WITH GFR
ALBUMIN: 4.1 g/dL (ref 3.6–5.1)
ALT: 23 U/L (ref 6–29)
AST: 20 U/L (ref 10–30)
Alkaline Phosphatase: 77 U/L (ref 33–115)
BILIRUBIN TOTAL: 0.4 mg/dL (ref 0.2–1.2)
BUN: 8 mg/dL (ref 7–25)
CALCIUM: 8.9 mg/dL (ref 8.6–10.2)
CO2: 24 mmol/L (ref 20–31)
CREATININE: 0.76 mg/dL (ref 0.50–1.10)
Chloride: 107 mmol/L (ref 98–110)
GFR, Est African American: >89 mL/min (ref >=60)
GFR, Est Non African American: >89 mL/min (ref >=60)
GLUCOSE: 89 mg/dL (ref 65–99)
POTASSIUM: 4.8 mmol/L (ref 3.5–5.3)
SODIUM: 139 mmol/L (ref 135–146)
TOTAL PROTEIN: 6.6 g/dL (ref 6.1–8.1)

## 2015-08-03 LAB — TSH: TSH: 1.132 u[IU]/mL (ref 0.350–4.500)

## 2015-08-04 ENCOUNTER — Other Ambulatory Visit: Payer: Self-pay | Admitting: Physician Assistant

## 2015-08-04 ENCOUNTER — Encounter: Payer: Self-pay | Admitting: Gastroenterology

## 2015-08-04 DIAGNOSIS — K591 Functional diarrhea: Secondary | ICD-10-CM

## 2015-08-04 LAB — H. PYLORI BREATH TEST: H. pylori Breath Test: NOT DETECTED

## 2015-08-06 ENCOUNTER — Telehealth: Payer: Self-pay

## 2015-08-06 NOTE — Telephone Encounter (Signed)
Pt called again regarding previous request. I explained that we are waiting for the physician to decide whether she needs to RTC or not. The pt explained that she cannot afford a copay, and that nothing has changed since her last visit.

## 2015-08-06 NOTE — Telephone Encounter (Signed)
patinet states abcess on leg  Is back and is asking for more medication to treat it.   [phone] (H)

## 2015-08-06 NOTE — Telephone Encounter (Signed)
Patient called again to see about getting more medication for the abscess on her leg. She is highly frustrated that she has not heard an answer yet. Although she only called today at 11:36am.

## 2015-08-06 NOTE — Telephone Encounter (Signed)
Advised husband that pt has to come back in.  Husband stated they did not have the funds to come back in and is ridiculous to come back in for the same exact abscess in the same exact place.  They seem very upset about this.

## 2015-08-06 NOTE — Telephone Encounter (Signed)
Per [name] has to be seen.

## 2015-08-06 NOTE — Telephone Encounter (Signed)
RTC

## 2015-08-07 NOTE — Telephone Encounter (Signed)
Per McVeigh's last note abscess was not fluctuant, thus Abx was appropriately prescribed.  She may benefit from I&D given the abscess has now returned.  Deliah BostonMichael Clark, MS, PA-C   11:17 AM, 08/07/2015

## 2015-08-07 NOTE — Telephone Encounter (Signed)
Noted. Pt will not come in.

## 2015-08-19 ENCOUNTER — Encounter: Payer: Self-pay | Admitting: Gastroenterology

## 2015-08-19 ENCOUNTER — Ambulatory Visit (INDEPENDENT_AMBULATORY_CARE_PROVIDER_SITE_OTHER): Payer: BLUE CROSS/BLUE SHIELD | Admitting: Gastroenterology

## 2015-08-19 ENCOUNTER — Other Ambulatory Visit (INDEPENDENT_AMBULATORY_CARE_PROVIDER_SITE_OTHER): Payer: BLUE CROSS/BLUE SHIELD

## 2015-08-19 VITALS — BP 104/64 | HR 92 | Ht 63.75 in | Wt 186.1 lb

## 2015-08-19 DIAGNOSIS — R197 Diarrhea, unspecified: Secondary | ICD-10-CM

## 2015-08-19 LAB — COMPREHENSIVE METABOLIC PANEL
ALBUMIN: 4.2 g/dL (ref 3.5–5.2)
ALK PHOS: 80 U/L (ref 39–117)
ALT: 13 U/L (ref 0–35)
AST: 14 U/L (ref 0–37)
BUN: 12 mg/dL (ref 6–23)
CO2: 24 mEq/L (ref 19–32)
Calcium: 9.1 mg/dL (ref 8.4–10.5)
Chloride: 108 mEq/L (ref 96–112)
Creatinine, Ser: 0.67 mg/dL (ref 0.40–1.20)
GFR: 111.47 mL/min (ref >60.00)
GLUCOSE: 87 mg/dL (ref 70–99)
POTASSIUM: 4.4 meq/L (ref 3.5–5.1)
Sodium: 141 mEq/L (ref 135–145)
TOTAL PROTEIN: 6.8 g/dL (ref 6.0–8.3)
Total Bilirubin: 0.4 mg/dL (ref 0.2–1.2)

## 2015-08-19 LAB — CBC WITH DIFFERENTIAL/PLATELET
BASOS PCT: 0.6 % (ref 0.0–3.0)
Basophils Absolute: 0 10*3/uL (ref 0.0–0.1)
EOS PCT: 0 % (ref 0.0–5.0)
Eosinophils Absolute: 0 10*3/uL (ref 0.0–0.7)
HCT: 41.8 % (ref 36.0–46.0)
Hemoglobin: 13.8 g/dL (ref 12.0–15.0)
LYMPHS ABS: 1.7 10*3/uL (ref 0.7–4.0)
Lymphocytes Relative: 28.4 % (ref 12.0–46.0)
MCHC: 33 g/dL (ref 30.0–36.0)
MCV: 85.8 fl (ref 78.0–100.0)
MONOS PCT: 7.9 % (ref 3.0–12.0)
Monocytes Absolute: 0.5 10*3/uL (ref 0.1–1.0)
NEUTROS PCT: 63.1 % (ref 43.0–77.0)
Neutro Abs: 3.8 10*3/uL (ref 1.4–7.7)
Platelets: 284 10*3/uL (ref 150.0–400.0)
RBC: 4.87 Mil/uL (ref 3.87–5.11)
RDW: 14 % (ref 11.5–15.5)
WBC: 6 10*3/uL (ref 4.0–10.5)

## 2015-08-19 LAB — IGA: IGA: 201 mg/dL (ref 68–378)

## 2015-08-19 LAB — SEDIMENTATION RATE: SED RATE: 10 mm/h (ref 0–22)

## 2015-08-19 NOTE — Progress Notes (Signed)
HPI: This is a  very pleasant 29 year old woman    who was referred to me by Ofilia Neaslark, Michael L, PA-C  to evaluate  diarrhea abdominal cramping .    Chief complaint is diarrhea abdominal cramping  She is here with her mother today  Labs 07/2015 cbc, cmet, h. Pylori breath test all normal.   Never normal, much worse in past 6 months.  No matter what she eats she has urgency, cramping, loose stools.     She usually has one normal, solid formed BM in AM.    Has wiped and noticed blood.  Pain in hips.  Has been having HAs 6 weeks.  1-2 beers per week.  1-2 caffeine per day.  Abscess inner left thigh, seems related to menstral cycle.  Antibiotics periodic  Review of systems: Pertinent positive and negative review of systems were noted in the above HPI section. Complete review of systems was performed and was otherwise normal.   Past Medical History  Diagnosis Date  . Anxiety   . Depression   . IBS (irritable bowel syndrome)     Past Surgical History  Procedure Laterality Date  . Dilation and curettage of uterus      x 3  . Wisdom tooth extraction      Current Outpatient Prescriptions  Medication Sig Dispense Refill  . Aspirin-Acetaminophen-Caffeine (EXCEDRIN PO) Take 1 tablet by mouth as needed.    . Lactobacillus (PROBIOTIC ACIDOPHILUS PO) Take by mouth.    . loperamide (IMODIUM) 2 MG capsule Take 1 capsule (2 mg total) by mouth every 4 (four) hours. Take as needed. 30 capsule 1   No current facility-administered medications for this visit.    Allergies as of 08/19/2015  . (No Known Allergies)    Family History  Problem Relation Age of Onset  . Breast cancer Paternal Grandmother   . Cervical cancer Cousin   . Prostate cancer Paternal Grandfather   . Colon polyps Paternal Grandfather   . Irritable bowel syndrome Father     Social History   Social History  . Marital Status: Married    Spouse Name: N/A  . Number of Children: 1  . Years of Education:  N/A   Occupational History  . stay at home mom    Social History Main Topics  . Smoking status: Current Every Day Smoker -- 0.50 packs/day    Types: Cigarettes  . Smokeless tobacco: Never Used  . Alcohol Use: 0.0 oz/week    0 Standard drinks or equivalent per week     Comment: less than 1 per day  . Drug Use: No  . Sexual Activity: Not on file   Other Topics Concern  . Not on file   Social History Narrative     Physical Exam: BP 104/64 mmHg  Pulse 92  Ht 5' 3.75" (1.619 m)  Wt 186 lb 2 oz (84.426 kg)  BMI 32.21 kg/m2  LMP 08/05/2015 Constitutional: generally well-appearing Psychiatric: alert and oriented x3 Eyes: extraocular movements intact Mouth: oral pharynx moist, no lesions Neck: supple no lymphadenopathy Cardiovascular: heart regular rate and rhythm Lungs: clear to auscultation bilaterally Abdomen: soft, nontender, nondistended, no obvious ascites, no peritoneal signs, normal bowel sounds Extremities: no lower extremity edema bilaterally Skin: no lesions on visible extremities   Assessment and plan: 29 y.o. female with  diarrhea, abdominal cramping  I am suspicious of colitis, inflammatory bowel disease, perhaps infection. Certainly nocturnal diarrhea is not normal and bears evaluation, explanation. She is going to get  a battery of blood tests and stool tests, see those summarized below and the instructions. If these are all negative then I would likely proceed with colonoscopy at her soonest convenience.   Rob Bunting, MD [facility] 08/19/2015, 10:09 AM  Cc: Ofilia Neas, PA-C

## 2015-08-19 NOTE — Patient Instructions (Signed)
You will have labs checked today in the basement lab.  Please head down after you check out with the front desk  (stool for C. Diff toxin, c. Diff by PCR, giardia antigen, routine ova parasites, routine stool culture; cbc, cmet, sed rate, IgA, IgA related tTG) Pending those results you may need a colonoscopy.

## 2015-08-21 ENCOUNTER — Other Ambulatory Visit: Payer: Self-pay | Admitting: Gastroenterology

## 2015-08-21 ENCOUNTER — Other Ambulatory Visit: Payer: BLUE CROSS/BLUE SHIELD

## 2015-08-21 DIAGNOSIS — R197 Diarrhea, unspecified: Secondary | ICD-10-CM

## 2015-08-21 LAB — TISSUE TRANSGLUTAMINASE, IGA: TISSUE TRANSGLUTAMINASE AB, IGA: 1 U/mL (ref <4)

## 2015-08-22 LAB — C. DIFFICILE GDH AND TOXIN A/B
C. DIFFICILE GDH: NOT DETECTED
C. difficile Toxin A/B: NOT DETECTED

## 2015-08-22 LAB — CLOSTRIDIUM DIFFICILE BY PCR: Toxigenic C. Difficile by PCR: NOT DETECTED

## 2015-08-24 LAB — STOOL CULTURE

## 2015-08-24 LAB — GIARDIA ANTIGEN: GIARDIA SCREEN (EIA): NEGATIVE

## 2015-08-24 LAB — OVA AND PARASITE EXAMINATION: OP: NONE SEEN

## 2015-08-27 ENCOUNTER — Encounter: Payer: Self-pay | Admitting: Family Medicine

## 2015-09-01 ENCOUNTER — Telehealth: Payer: Self-pay

## 2015-09-01 NOTE — Telephone Encounter (Signed)
Wants [facility] to [facility] - Rodena PietyLenny Peters   [facility] to [facility] to [facility]  Michael [facility]  Call back  [phone]

## 2015-09-01 NOTE — Telephone Encounter (Signed)
Referrals can we change this?

## 2015-09-02 NOTE — Telephone Encounter (Signed)
She went to her appointment at [facility] on 08/19/15.  I have reopened the referral and sent it to [facility] for Dr Rodena PietyLenny Peters.  She may need to request that her records are sent from [facility] to [facility], since this is a second opinion.

## 2015-09-02 NOTE — Telephone Encounter (Signed)
Pt advised.

## 2015-09-27 NOTE — L&D Delivery Note (Addendum)
Delivery Note  First Stage: Labor onset: 1100 Augmentation : Pitocin Analgesia /Anesthesia intrapartum: epidural AROM at 1311  Second Stage: Complete dilation at 1459 Onset of pushing at 1500 FHR second stage 135 bpm  Delivery of a viable female at 1515 by CNM in OA position Loose nuchal cord x 1 loop around the back of baby's neck following delivery Cord double clamped after cessation of pulsation, cut by FOB Cord blood sample collected    Third Stage: Placenta delivered via Tomasa BlaseSchultz intact with 3 VC @ 1518 Placenta disposition: L&D Uterine tone: firm / bleeding: brief period of brisk bleeding that quickly resolved with fundal massage  2nd degree perineal laceration identified  Anesthesia for repair: epidural Repair 3-0, 4-0 vicryl Est. Blood Loss (mL): 150  Complications: none  Mom to postpartum.  Baby to Couplet care / Skin to Skin.  Newborn: Birth Weight: 7 lbs 6 oz  Apgar Scores: 8/9 Feeding planned: breast  Raelyn MoraAWSON, Exavier Lina, M  MSN, CNM 07/27/2016, 3:39 PM

## 2016-01-15 LAB — OB RESULTS CONSOLE RUBELLA ANTIBODY, IGM: Rubella: IMMUNE

## 2016-01-15 LAB — OB RESULTS CONSOLE HIV ANTIBODY (ROUTINE TESTING): HIV: NONREACTIVE

## 2016-01-15 LAB — OB RESULTS CONSOLE ANTIBODY SCREEN: Antibody Screen: NEGATIVE

## 2016-01-15 LAB — OB RESULTS CONSOLE GC/CHLAMYDIA
CHLAMYDIA, DNA PROBE: NEGATIVE
GC PROBE AMP, GENITAL: NEGATIVE

## 2016-01-15 LAB — OB RESULTS CONSOLE HEPATITIS B SURFACE ANTIGEN: Hepatitis B Surface Ag: NEGATIVE

## 2016-01-15 LAB — OB RESULTS CONSOLE RPR: RPR: NONREACTIVE

## 2016-01-15 LAB — OB RESULTS CONSOLE ABO/RH: RH Type: POSITIVE

## 2016-06-28 LAB — OB RESULTS CONSOLE GBS: STREP GROUP B AG: NEGATIVE

## 2016-07-15 ENCOUNTER — Telehealth (HOSPITAL_COMMUNITY): Payer: Self-pay | Admitting: *Deleted

## 2016-07-15 ENCOUNTER — Encounter (HOSPITAL_COMMUNITY): Payer: Self-pay | Admitting: *Deleted

## 2016-07-15 NOTE — Telephone Encounter (Signed)
Preadmission screen  

## 2016-07-22 ENCOUNTER — Other Ambulatory Visit: Payer: Self-pay | Admitting: Obstetrics

## 2016-07-27 ENCOUNTER — Inpatient Hospital Stay (HOSPITAL_COMMUNITY): Payer: Managed Care, Other (non HMO) | Admitting: Anesthesiology

## 2016-07-27 ENCOUNTER — Inpatient Hospital Stay (HOSPITAL_COMMUNITY)
Admission: RE | Admit: 2016-07-27 | Discharge: 2016-07-27 | Disposition: A | Discharge status: Home / Self Care | Payer: BLUE CROSS/BLUE SHIELD | Source: Ambulatory Visit | Attending: Obstetrics | Admitting: Obstetrics

## 2016-07-27 ENCOUNTER — Encounter (HOSPITAL_COMMUNITY): Payer: Self-pay | Admitting: *Deleted

## 2016-07-27 ENCOUNTER — Inpatient Hospital Stay (HOSPITAL_COMMUNITY)
Admission: AD | Admit: 2016-07-27 | Discharge: 2016-07-28 | DRG: 775 | Disposition: A | Discharge status: Home / Self Care | Payer: Managed Care, Other (non HMO) | Source: Ambulatory Visit | Attending: Obstetrics | Admitting: Obstetrics

## 2016-07-27 DIAGNOSIS — O99334 Smoking (tobacco) complicating childbirth: Secondary | ICD-10-CM | POA: Diagnosis present

## 2016-07-27 DIAGNOSIS — F17210 Nicotine dependence, cigarettes, uncomplicated: Secondary | ICD-10-CM | POA: Diagnosis present

## 2016-07-27 DIAGNOSIS — Z3A39 39 weeks gestation of pregnancy: Secondary | ICD-10-CM

## 2016-07-27 DIAGNOSIS — Z3483 Encounter for supervision of other normal pregnancy, third trimester: Secondary | ICD-10-CM | POA: Diagnosis present

## 2016-07-27 DIAGNOSIS — Z3490 Encounter for supervision of normal pregnancy, unspecified, unspecified trimester: Secondary | ICD-10-CM

## 2016-07-27 LAB — CBC
HEMATOCRIT: 32.4 % — AB (ref 36.0–46.0)
HEMOGLOBIN: 10.3 g/dL — AB (ref 12.0–15.0)
MCH: 24.4 pg — AB (ref 26.0–34.0)
MCHC: 31.8 g/dL (ref 30.0–36.0)
MCV: 76.8 fL — AB (ref 78.0–100.0)
Platelets: 391 10*3/uL (ref 150–400)
RBC: 4.22 MIL/uL (ref 3.87–5.11)
RDW: 15.4 % (ref 11.5–15.5)
WBC: 12.8 10*3/uL — AB (ref 4.0–10.5)

## 2016-07-27 LAB — RPR: RPR: NONREACTIVE

## 2016-07-27 LAB — TYPE AND SCREEN
ABO/RH(D): O POS
Antibody Screen: NEGATIVE

## 2016-07-27 MED ORDER — COCONUT OIL OIL: 1.0000 "application " | TOPICAL_OIL | Status: DC | PRN

## 2016-07-27 MED ORDER — OXYCODONE HCL 5 MG PO TABS
5.0000 mg | ORAL_TABLET | ORAL | Status: DC | PRN
  Administered 2016-07-28 (×3): 5 mg via ORAL
  Filled 2016-07-27 (×4): qty 1

## 2016-07-27 MED ORDER — TERBUTALINE SULFATE 1 MG/ML IJ SOLN
0.2500 mg | Freq: Once | INTRAMUSCULAR | Status: DC | PRN
  Filled 2016-07-27: qty 1

## 2016-07-27 MED ORDER — OXYCODONE HCL 5 MG PO TABS
10.0000 mg | ORAL_TABLET | ORAL | Status: DC | PRN
  Administered 2016-07-28: 10 mg via ORAL
  Filled 2016-07-27: qty 2

## 2016-07-27 MED ORDER — WITCH HAZEL-GLYCERIN EX PADS
1.0000 "application " | MEDICATED_PAD | CUTANEOUS | Status: DC | PRN
  Administered 2016-07-27: 1 via TOPICAL

## 2016-07-27 MED ORDER — PHENYLEPHRINE 40 MCG/ML (10ML) SYRINGE FOR IV PUSH (FOR BLOOD PRESSURE SUPPORT)
80.0000 ug | PREFILLED_SYRINGE | INTRAVENOUS | Status: DC | PRN
  Filled 2016-07-27: qty 10
  Filled 2016-07-27: qty 5

## 2016-07-27 MED ORDER — DIBUCAINE 1 % RE OINT: 1.0000 "application " | TOPICAL_OINTMENT | RECTAL | Status: DC | PRN

## 2016-07-27 MED ORDER — DIPHENHYDRAMINE HCL 25 MG PO CAPS: 25.0000 mg | ORAL_CAPSULE | Freq: Four times a day (QID) | ORAL | Status: DC | PRN

## 2016-07-27 MED ORDER — PHENYLEPHRINE 40 MCG/ML (10ML) SYRINGE FOR IV PUSH (FOR BLOOD PRESSURE SUPPORT)
80.0000 ug | PREFILLED_SYRINGE | INTRAVENOUS | Status: DC | PRN
  Filled 2016-07-27: qty 5

## 2016-07-27 MED ORDER — LIDOCAINE HCL (PF) 1 % IJ SOLN
30.0000 mL | INTRAMUSCULAR | Status: DC | PRN
  Filled 2016-07-27: qty 30

## 2016-07-27 MED ORDER — SENNOSIDES-DOCUSATE SODIUM 8.6-50 MG PO TABS: 2.0000 | ORAL_TABLET | ORAL | Status: DC

## 2016-07-27 MED ORDER — PRENATAL MULTIVITAMIN CH
1.0000 | ORAL_TABLET | Freq: Every day | ORAL | Status: DC
  Administered 2016-07-28: 1 via ORAL
  Filled 2016-07-27: qty 1

## 2016-07-27 MED ORDER — ONDANSETRON HCL 4 MG/2ML IJ SOLN
4.0000 mg | INTRAMUSCULAR | Status: DC | PRN
Start: 2016-07-27 — End: 2016-07-28

## 2016-07-27 MED ORDER — FENTANYL 2.5 MCG/ML BUPIVACAINE 1/10 % EPIDURAL INFUSION (WH - ANES)
14.0000 mL/h | INTRAMUSCULAR | Status: DC | PRN
  Administered 2016-07-27 (×2): 14 mL/h via EPIDURAL
  Filled 2016-07-27 (×2): qty 125

## 2016-07-27 MED ORDER — SIMETHICONE 80 MG PO CHEW: 80.0000 mg | CHEWABLE_TABLET | ORAL | Status: DC | PRN

## 2016-07-27 MED ORDER — LACTATED RINGERS IV SOLN: 500.0000 mL | INTRAVENOUS | Status: DC | PRN

## 2016-07-27 MED ORDER — EPHEDRINE 5 MG/ML INJ
10.0000 mg | INTRAVENOUS | Status: DC | PRN
  Filled 2016-07-27: qty 4

## 2016-07-27 MED ORDER — OXYCODONE-ACETAMINOPHEN 5-325 MG PO TABS: 2.0000 | ORAL_TABLET | ORAL | Status: DC | PRN

## 2016-07-27 MED ORDER — OXYTOCIN 40 UNITS IN LACTATED RINGERS INFUSION - SIMPLE MED
1.0000 m[IU]/min | INTRAVENOUS | Status: DC
  Administered 2016-07-27: 2 m[IU]/min via INTRAVENOUS
  Filled 2016-07-27: qty 1000

## 2016-07-27 MED ORDER — ONDANSETRON HCL 4 MG PO TABS: 4.0000 mg | ORAL_TABLET | ORAL | Status: DC | PRN

## 2016-07-27 MED ORDER — LACTATED RINGERS IV SOLN: 500.0000 mL | Freq: Once | INTRAVENOUS | Status: DC

## 2016-07-27 MED ORDER — ONDANSETRON HCL 4 MG/2ML IJ SOLN: 4.0000 mg | Freq: Four times a day (QID) | INTRAMUSCULAR | Status: DC | PRN

## 2016-07-27 MED ORDER — TETANUS-DIPHTH-ACELL PERTUSSIS 5-2.5-18.5 LF-MCG/0.5 IM SUSP: 0.5000 mL | Freq: Once | INTRAMUSCULAR | Status: DC

## 2016-07-27 MED ORDER — DIPHENHYDRAMINE HCL 50 MG/ML IJ SOLN: 12.5000 mg | INTRAMUSCULAR | Status: DC | PRN

## 2016-07-27 MED ORDER — OXYTOCIN 40 UNITS IN LACTATED RINGERS INFUSION - SIMPLE MED
2.5000 [IU]/h | INTRAVENOUS | Status: DC
Start: 2016-07-27 — End: 2016-07-27

## 2016-07-27 MED ORDER — OXYCODONE-ACETAMINOPHEN 5-325 MG PO TABS: 1.0000 | ORAL_TABLET | ORAL | Status: DC | PRN

## 2016-07-27 MED ORDER — IBUPROFEN 600 MG PO TABS
600.0000 mg | ORAL_TABLET | Freq: Four times a day (QID) | ORAL | Status: DC
  Administered 2016-07-27 – 2016-07-28 (×4): 600 mg via ORAL
  Filled 2016-07-27 (×4): qty 1

## 2016-07-27 MED ORDER — SOD CITRATE-CITRIC ACID 500-334 MG/5ML PO SOLN: 30.0000 mL | ORAL | Status: DC | PRN

## 2016-07-27 MED ORDER — ACETAMINOPHEN 325 MG PO TABS
650.0000 mg | ORAL_TABLET | ORAL | Status: DC | PRN
  Administered 2016-07-28 (×2): 650 mg via ORAL
  Filled 2016-07-27 (×2): qty 2

## 2016-07-27 MED ORDER — BENZOCAINE-MENTHOL 20-0.5 % EX AERO
1.0000 "application " | INHALATION_SPRAY | CUTANEOUS | Status: DC | PRN
  Administered 2016-07-27: 1 via TOPICAL
  Filled 2016-07-27: qty 56

## 2016-07-27 MED ORDER — LIDOCAINE HCL (PF) 1 % IJ SOLN
INTRAMUSCULAR | Status: DC | PRN
  Administered 2016-07-27: 6 mL via EPIDURAL
  Administered 2016-07-27: 4 mL via EPIDURAL

## 2016-07-27 MED ORDER — ZOLPIDEM TARTRATE 5 MG PO TABS: 5.0000 mg | ORAL_TABLET | Freq: Every evening | ORAL | Status: DC | PRN

## 2016-07-27 MED ORDER — ACETAMINOPHEN 325 MG PO TABS: 650.0000 mg | ORAL_TABLET | ORAL | Status: DC | PRN

## 2016-07-27 MED ORDER — OXYTOCIN BOLUS FROM INFUSION: 500.0000 mL | Freq: Once | INTRAVENOUS | Status: DC

## 2016-07-27 MED ORDER — LACTATED RINGERS IV SOLN
INTRAVENOUS | Status: DC
  Administered 2016-07-27 (×2): via INTRAVENOUS

## 2016-07-27 NOTE — H&P (Signed)
Leslie Small is a 30 y.o. [phone] [id number] at 2333w1d presenting for elective IOL at term. Pt notes occasional contractions . Good fetal movement, No vaginal bleeding, not leaking fluid.  [facility] 7 wks - Dated by LMP c/w 7 wk u/s - growth at 36 wjs- 7'3/ 88%    Prenatal Transfer Tool  Maternal Diabetes: No Genetic Screening: Normal Maternal Ultrasounds/Referrals: Normal Fetal Ultrasounds or other Referrals:  None Maternal Substance Abuse:  No Significant Maternal Medications:  None Significant Maternal Lab Results: None     OB History    Gravida Para Term Preterm AB Living   4 1     2 1    SAB TAB Ectopic Multiple Live Births   2             Past Medical History:  Diagnosis Date  . Anxiety   . Depression   . IBS (irritable bowel syndrome)    Past Surgical History:  Procedure Laterality Date  . DILATION AND CURETTAGE OF UTERUS     x 3  . WISDOM TOOTH EXTRACTION     Family History: family history includes Breast cancer in her paternal grandmother; Cervical cancer in her cousin; Colon polyps in her paternal grandfather; Irritable bowel syndrome in her father; Prostate cancer in her paternal grandfather. Social History:  reports that she has been smoking Cigarettes.  She has been smoking about 0.50 packs per day. She has never used smokeless tobacco. She reports that she drinks alcohol. She reports that she does not use drugs.  Review of Systems - Negative except discomfort of pregnancy     Blood pressure 114/61, pulse 99, temperature 99 F (37.2 C), temperature source Oral, resp. rate 18, last menstrual period 08/05/2015.   Prenatal labs: ABO, Rh: O/Positive/-- (04/21 0000) Antibody: Negative (04/21 0000) Rubella: !Error! immune RPR: Nonreactive (04/21 0000)  HBsAg: Negative (04/21 0000)  HIV: Non-reactive (04/21 0000)  GBS: Negative (10/03 0000)  1 hr Glucola 98  Genetic screening nl quad Anatomy US normal   Assessment/Plan: 30 y.o. [id number] at  6233w1d Elective IOL. Plan pitocin, AROM when able GBS neg   Anetta Olvera A. 07/27/2016, 7:43 AM

## 2016-07-27 NOTE — Anesthesia Preprocedure Evaluation (Signed)
Anesthesia Evaluation  Patient identified by MRN, date of birth, ID band Patient awake    Reviewed: Allergy & Precautions, H&P , Patient's Chart, lab work & pertinent test results  Airway Mallampati: II  TM Distance: >3 FB Neck ROM: full    Dental  (+) Teeth Intact   Pulmonary Current Smoker,    breath sounds clear to auscultation       Cardiovascular  Rhythm:regular Rate:Normal     Neuro/Psych    GI/Hepatic   Endo/Other  Morbid obesity  Renal/GU      Musculoskeletal   Abdominal   Peds  Hematology   Anesthesia Other Findings       Reproductive/Obstetrics (+) Pregnancy                             Anesthesia Physical Anesthesia Plan  ASA: III  Anesthesia Plan: Epidural   Post-op Pain Management:    Induction:   Airway Management Planned:   Additional Equipment:   Intra-op Plan:   Post-operative Plan:   Informed Consent: I have reviewed the patients History and Physical, chart, labs and discussed the procedure including the risks, benefits and alternatives for the proposed anesthesia with the patient or authorized representative who has indicated his/her understanding and acceptance.   Dental Advisory Given  Plan Discussed with:   Anesthesia Plan Comments: (Labs checked- platelets confirmed with RN in room. Fetal heart tracing, per RN, reported to be stable enough for sitting procedure. Discussed epidural, and patient consents to the procedure:  included risk of possible headache,backache, failed block, allergic reaction, and nerve injury. This patient was asked if she had any questions or concerns before the procedure started.)        Anesthesia Quick Evaluation

## 2016-07-27 NOTE — Anesthesia Pain Management Evaluation Note (Signed)
  CRNA Pain Management Visit Note  Patient: Leslie BlossomSarah L Kulinski, 30 y.o., female  "Hello I am a member of the anesthesia team at [facility]. We have an anesthesia team available at all times to provide care throughout the hospital, including epidural management and anesthesia for C-section. I don't know your plan for the delivery whether it a natural birth, water birth, IV sedation, nitrous supplementation, doula or epidural, but we want to meet your pain goals."   1.Was your pain managed to your expectations on prior hospitalizations?   Yes   2.What is your expectation for pain management during this hospitalization?     Epidural  3.How can we help you reach that goal?   Record the patient's initial score and the patient's pain goal.   Pain: 7  Pain Goal: 7 [facility] wants you to be able to say your pain was always managed very well.  [name],[name] 07/27/2016

## 2016-07-27 NOTE — Progress Notes (Signed)
Patient ID: Leslie Small, female   DOB: Dec 22, 1985, 30 y.o.   MRN: [id number] On stand-by for delivery per request of Dr. Ernestina PennaFogleman - while in surgery S: Doing well, pain well-managed with an epidural.  Sitting up in bed on cellphone talking with spouse. "Ready to get the baby out."  O: Vitals:   07/27/16 1201 07/27/16 1231 07/27/16 1301 07/27/16 1331  BP: (!) 92/48 (!) 92/46 (!) 91/51 (!) 103/58  Pulse: 68 73 74 77  Resp:      Temp:      TempSrc:      SpO2:      Weight:      Height:         FHT:  FHR: 130 bpm, variability: moderate,  accelerations:  Present,  decelerations:  Present variables UC:   regular, every 2-3 minutes SVE:   Dilation: 9 Effacement (%): 100 Station: +1 Exam by:: J.Cox, RN   A / P: Induction of labor due to term with favorable cervix,  progressing well on pitocin  Fetal Wellbeing:  Category I Pain Control:  Epidural  Anticipated MOD:  NSVD  Kenard GowerAWSON, Leslie Small, M, MSN, CNM 07/27/2016, 2:00 PM

## 2016-07-27 NOTE — Lactation Note (Signed)
This note was copied from a baby's chart. Lactation Consultation Note  Patient Name: Leslie Small WUJWJ'XToday's Date: 07/27/2016 Reason for consult: Initial assessment   Initial assessment with first time BF mom of < 1 hour old infant [facility]. Mom reports she was unable to BF her first child due to maternal illness. Infant STS with mom and showing feeding cues.   Mom with large compressible breasts and areola. Nipples are semi flat and evert with stimulation. Showed mom how to hand express, colostrum noted to both breasts. Mom reports + breast changes with pregnancy.   Assisted mom in latching infant to right breast, she was fussy and did not latch. Assisted mom in repositioning and latching infant STS to left breast, she latched after a few minutes and was noted to have flanged lips, rhythmic suckles and intermittent swallows. Infant came on and off a few times and then settled in to rhythmic suckling. Tea cup hold worked best with getting infant latched to breast. Mom denied pain/pinching with feeding.   BF basics, positioning, pillow support, colostrum, milk coming to volume, and cluster feeding discussed. Enc mom to feed infant STS 8-12 x in 24 hours at first feeding cues. Enc mom to hand express prior to latch and after feeding to apply EBM to nipples.   BF Recourses Handout and LC Brochure given, informed mom of IP/OP Services, BF Support Groups and LC phone #. Enc mom to call out to desk for feeding assistance as needed. Mom has a pump at home, she is unsure of what brand. Follow up tomorrow and prn.       Maternal Data Formula Feeding for Exclusion: No Has patient been taught Hand Expression?: Yes Does the patient have breastfeeding experience prior to this delivery?: No (was not able to BF first child due to maternal illness)  Feeding Feeding Type: Breast Fed Length of feed: 15 min (still feeding when I left the room)  LATCH Score/Interventions Latch: Repeated attempts  needed to sustain latch, nipple held in mouth throughout feeding, stimulation needed to elicit sucking reflex. Intervention(s): Adjust position;Assist with latch;Breast massage;Breast compression  Audible Swallowing: A few with stimulation  Type of Nipple: Everted at rest and after stimulation (semi flat at rest, everted wtih stimulation)  Comfort (Breast/Nipple): Soft / non-tender     Hold (Positioning): Assistance needed to correctly position infant at breast and maintain latch. Intervention(s): Breastfeeding basics reviewed;Support Pillows;Position options;Skin to skin  LATCH Score: 7  Lactation Tools Discussed/Used WIC Program: No   Consult Status Consult Status: Follow-up Date: 07/28/16 Follow-up type: In-patient    [name] 07/27/2016, 4:30 PM

## 2016-07-27 NOTE — Anesthesia Procedure Notes (Signed)

## 2016-07-27 NOTE — Anesthesia Postprocedure Evaluation (Signed)
Anesthesia Post Note  Patient: Leslie Small  Procedure(s) Performed: * No procedures listed *  Patient location during evaluation: Mother Baby Anesthesia Type: Epidural Level of consciousness: awake and alert Pain management: satisfactory to patient Vital Signs Assessment: post-procedure vital signs reviewed and stable Respiratory status: respiratory function stable Cardiovascular status: stable Postop Assessment: no headache, no backache, epidural receding, patient able to bend at knees, no signs of nausea or vomiting and adequate PO intake Anesthetic complications: no     Last Vitals:  Vitals:   07/27/16 1631 07/27/16 1700  BP: 108/62 (!) 104/56  Pulse: 84 75  Resp:  16  Temp:  36.8 C    Last Pain:  Vitals:   07/27/16 1700  TempSrc: Oral  PainSc: 0-No pain   Pain Goal:                 [name]

## 2016-07-28 LAB — CBC
HEMATOCRIT: 26.7 % — AB (ref 36.0–46.0)
HEMOGLOBIN: 8.6 g/dL — AB (ref 12.0–15.0)
MCH: 25.1 pg — AB (ref 26.0–34.0)
MCHC: 32.2 g/dL (ref 30.0–36.0)
MCV: 77.8 fL — AB (ref 78.0–100.0)
Platelets: 313 10*3/uL (ref 150–400)
RBC: 3.43 MIL/uL — AB (ref 3.87–5.11)
RDW: 15.7 % — ABNORMAL HIGH (ref 11.5–15.5)
WBC: 18.8 10*3/uL — ABNORMAL HIGH (ref 4.0–10.5)

## 2016-07-28 MED ORDER — MAGNESIUM OXIDE 400 (241.3 MG) MG PO TABS: 400.0000 mg | ORAL_TABLET | Freq: Every day | ORAL | 0 refills | Status: DC

## 2016-07-28 MED ORDER — IBUPROFEN 800 MG PO TABS: 800.0000 mg | ORAL_TABLET | Freq: Four times a day (QID) | ORAL | 0 refills | Status: DC

## 2016-07-28 MED ORDER — POLYSACCHARIDE IRON COMPLEX 150 MG PO CAPS
150.0000 mg | ORAL_CAPSULE | Freq: Every day | ORAL | Status: DC
  Administered 2016-07-28: 150 mg via ORAL
  Filled 2016-07-28: qty 1

## 2016-07-28 MED ORDER — OXYCODONE HCL 5 MG PO TABS: 5.0000 mg | ORAL_TABLET | ORAL | 0 refills | Status: DC | PRN

## 2016-07-28 MED ORDER — POLYSACCHARIDE IRON COMPLEX 150 MG PO CAPS: 150.0000 mg | ORAL_CAPSULE | Freq: Every day | ORAL | 0 refills | Status: DC

## 2016-07-28 MED ORDER — MAGNESIUM OXIDE 400 (241.3 MG) MG PO TABS
400.0000 mg | ORAL_TABLET | Freq: Every day | ORAL | Status: DC
  Administered 2016-07-28: 400 mg via ORAL
  Filled 2016-07-28 (×2): qty 1

## 2016-07-28 NOTE — Progress Notes (Signed)
PPD 1 SVD with 2nd degree repair  S:  Reports feeling tired - ok - wants early DC             Tolerating po/ No nausea or vomiting             Bleeding is light             Pain controlled with motrin with minimal relief             Up ad lib / ambulatory / voiding QS  Newborn breast feeding   O:               VS: BP 116/61 (BP Location: Right Arm)   Pulse 82   Temp 98.3 F (36.8 C) (Oral)   Resp 18   Ht 5' 2.5" (1.588 m)   Wt 93.9 kg (207 lb)   LMP 08/05/2015   SpO2 99%   Breastfeeding? Unknown   BMI 37.26 kg/m    LABS:              Recent Labs  07/27/16 0800 07/28/16 0640  WBC 12.8* 18.8*  HGB 10.3* 8.6*  PLT 391 313               Blood type: --/--/O POS (11/01 0800)  Rubella: Immune (04/21 0000)                     I&O: Intake/Output      11/01 0701 - 11/02 0700 11/02 0701 - 11/03 0700   Urine (mL/kg/hr) 1200 (0.5)    Total Output 1200     Net -1200                      Physical Exam:             Alert and oriented X3  Lungs: Clear and unlabored  Heart: regular rate and rhythm / no mumurs  Abdomen: soft, non-tender, non-distended              Fundus: firm, non-tender, U-1  Perineum: ice pack in place  Lochia: light  Extremities: no edema, no calf pain or tenderness    A: PPD # 1 SVD with 2nd degree repair             IDA of pregnancy with ABL anemia  Doing well - stable status  P: Routine post partum orders  DC home this PM after 24 hours PP  Leslie Small, Leslie CNM, Leslie Small, Leslie Small 07/28/2016, 7:55 AM

## 2016-07-28 NOTE — Discharge Summary (Signed)
Obstetric Discharge Summary Reason for Admission: onset of labor Prenatal Procedures: none Intrapartum Procedures: spontaneous vaginal delivery and epidural Postpartum Procedures: none Complications-Operative and Postpartum: 2nd degree perineal laceration Hemoglobin  Date Value Ref Range Status  07/28/2016 8.6 (L) 12.0 - 15.0 g/dL Final   HCT  Date Value Ref Range Status  07/28/2016 26.7 (L) 36.0 - 46.0 % Final    Physical Exam:  General: alert, cooperative and no distress Lochia: appropriate Uterine Fundus: firm Incision: healing well DVT Evaluation: No evidence of DVT seen on physical exam.  Discharge Diagnoses: Term Pregnancy-delivered  Discharge Information: Date: 07/28/2016 Activity: pelvic rest Diet: routine Medications: PNV, Ibuprofen, Iron, Percocet and magnesium Condition: stable Instructions: refer to practice specific booklet Discharge to: home Follow-up Information    [facility]. Schedule an appointment as soon as possible for a visit in 6 week(s).   Specialty:  Obstetrics and Gynecology Contact information: [address] [place] [id number] [phone]           Newborn Data: Live born female  Birth Weight: 7 lb 6 oz (3345 g) APGAR: 8, 9  Home with mother.  Leslie MikeBAILEY, TANYA 07/28/2016, 8:52 AM

## 2016-07-28 NOTE — Progress Notes (Signed)
[name] was referred for history of depression/anxiety. * Referral screened out by Clinical Social Worker because none of the following criteria appear to apply: ~ History of anxiety/depression during this pregnancy, or of post-partum depression. ~ Diagnosis of anxiety and/or depression within last 3 years OR * [name]'s symptoms currently being treated with medication and/or therapy.  CSW completed chart review and no indication of mental health concerns prenatally.  [name] also acknowledged MH concerns was when [name] was 18.  [name] has had no signs of symptoms of anxiety or depression in over 6 years.   Please contact the Clinical Social Worker if needs arise, or if [name] requests.  Leslie Small, MSW, LCSW Clinical Social Work [phone]  

## 2016-07-28 NOTE — Lactation Note (Signed)
This note was copied from a baby's chart. Lactation Consultation Note  Patient Name: Girl Harlan StainsSarah Ethridge ZOXWR'UToday's Date: 07/28/2016  Mom had baby at breast [facility]. Baby at end of feeding but demonstrated some good suckling bursts. Mom reports she feels baby is nursing well. Wants d/c today after baby is 5224 hours old. Reviewed basic teaching with Mom. Advised baby should be at breast 8-12 times in 24 hours and with feeding ques, nursing for 15-20 minutes both breasts some feedings. Cluster feeding discussed. Engorgement care reviewed, refer to Baby N Me booklet page 24. Advised to monitor output. Advised of OP services and support group. Mom denies other questions/concerns.    Maternal Data    Feeding Feeding Type: Breast Fed Length of feed: 20 min  LATCH Score/Interventions                      Lactation Tools Discussed/Used     Consult Status      Alfred LevinsGranger, Buelah Rennie Ann 07/28/2016, 2:41 PM

## 2017-05-11 ENCOUNTER — Encounter: Payer: Self-pay | Admitting: Family Medicine

## 2017-05-11 ENCOUNTER — Ambulatory Visit (INDEPENDENT_AMBULATORY_CARE_PROVIDER_SITE_OTHER): Payer: Managed Care, Other (non HMO) | Admitting: Family Medicine

## 2017-05-11 VITALS — BP 112/75 | HR 111 | Temp 98.6°F | Resp 18 | Ht 62.5 in | Wt 171.4 lb

## 2017-05-11 DIAGNOSIS — J069 Acute upper respiratory infection, unspecified: Secondary | ICD-10-CM

## 2017-05-11 DIAGNOSIS — H6122 Impacted cerumen, left ear: Secondary | ICD-10-CM | POA: Diagnosis not present

## 2017-05-11 DIAGNOSIS — J9801 Acute bronchospasm: Secondary | ICD-10-CM

## 2017-05-11 MED ORDER — ALBUTEROL SULFATE HFA 108 (90 BASE) MCG/ACT IN AERS: 2.0000 | INHALATION_SPRAY | Freq: Four times a day (QID) | RESPIRATORY_TRACT | 0 refills | Status: DC | PRN

## 2017-05-11 MED ORDER — PREDNISONE 20 MG PO TABS: 40.0000 mg | ORAL_TABLET | Freq: Every day | ORAL | 0 refills | Status: AC

## 2017-05-11 NOTE — Patient Instructions (Signed)
     IF you received an x-ray today, you will receive an invoice from [facility]. Please contact [facility] at [phone] with questions or concerns regarding your invoice.   IF you received labwork today, you will receive an invoice from [facility]. Please contact [facility] at [phone] with questions or concerns regarding your invoice.   Our billing staff will not be able to assist you with questions regarding bills from these companies.  You will be contacted with the lab results as soon as they are available. The fastest way to get your results is to activate your My Chart account. Instructions are located on the last page of this paperwork. If you have not heard from us regarding the results in 2 weeks, please contact this office.     

## 2017-05-11 NOTE — Progress Notes (Signed)
8/16/20183:15 PM  Leslie Small [birth year], 31 y.o. female [id number]  Chief Complaint  Patient presents with  . URI    x3 days, pt has tried OTC meds and it hasn't help.    HPI:   Patient is a 31 y.o. female who presents today asking for oral steroids as she has an URI. Patient states that for the past 3 days she has been having a dry hacking cough, where she has had difficulty catching her breath and has felt tight in her chest, mild wheezing, after coughing episodes. She has some mild sore throat, nasal congestion and sinus pressure. No ear pain, no fever or chills, no nausea, vomiting, abd pain or diarrhea. She has these URI requiring steroids about 2 a year, normally with "change in weather" She used to smoke, has quite with the birth of her younger daughter in 07/2016 She denies any h/o asthma or COPD  Depression screen [facility] 2/9 05/11/2017 07/31/2015 06/13/2015  Decreased Interest 0 0 0  Down, Depressed, Hopeless 0 0 0  PHQ - 2 Score 0 0 0    No Known Allergies  Current Outpatient Prescriptions on File Prior to Visit  Medication Sig Dispense Refill  . ibuprofen (ADVIL,MOTRIN) 800 MG tablet Take 1 tablet (800 mg total) by mouth every 6 (six) hours. (Patient not taking: Reported on 05/11/2017) 45 tablet 0  . iron polysaccharides (NIFEREX) 150 MG capsule Take 1 capsule (150 mg total) by mouth daily. (Patient not taking: Reported on 05/11/2017) 45 capsule 0  . magnesium oxide (MAG-OX) 400 (241.3 Mg) MG tablet Take 1 tablet (400 mg total) by mouth daily. (Patient not taking: Reported on 05/11/2017) 45 tablet 0  . oxyCODONE (OXY IR/ROXICODONE) 5 MG immediate release tablet Take 1 tablet (5 mg total) by mouth every 4 (four) hours as needed (pain scale 4-7). (Patient not taking: Reported on 05/11/2017) 15 tablet 0  . Prenatal Vit-Fe Fumarate-FA (PRENATAL MULTIVITAMIN) TABS tablet Take 1 tablet by mouth daily at 12 noon.     No current facility-administered medications on file prior to  visit.     Past Medical History:  Diagnosis Date  . Anxiety   . Depression   . IBS (irritable bowel syndrome)   . Postpartum care following vaginal delivery (11/1) 07/27/2016    Past Surgical History:  Procedure Laterality Date  . DILATION AND CURETTAGE OF UTERUS     x 3  . WISDOM TOOTH EXTRACTION      Social History  Substance Use Topics  . Smoking status: Current Every Day Smoker    Packs/day: 0.50    Types: Cigarettes  . Smokeless tobacco: Never Used  . Alcohol use 0.0 oz/week     Comment: less than 1 per day    Family History  Problem Relation Age of Onset  . Breast cancer Paternal Grandmother   . Cervical cancer Cousin   . Prostate cancer Paternal Grandfather   . Colon polyps Paternal Grandfather   . Irritable bowel syndrome Father     Review of Systems  Constitutional: Negative for chills and fever.  HENT: Positive for congestion, sinus pain and sore throat. Negative for ear pain.   Respiratory: Positive for cough and wheezing. Negative for sputum production.   Cardiovascular: Negative for chest pain, palpitations and leg swelling.  Gastrointestinal: Negative for abdominal pain, nausea and vomiting.     OBJECTIVE:  Vitals:   05/11/17 1443  Weight: 171 lb 6.4 oz (77.7 kg)  Height: 5' 2.5" (1.588 m)  Physical Exam  Constitutional: She is oriented to person, place, and time and well-developed, well-nourished, and in no distress.  HENT:  Head: Normocephalic and atraumatic.  Right Ear: Hearing, tympanic membrane, external ear and ear canal normal.  Left Ear: Hearing, external ear and ear canal normal.  Mouth/Throat: Oropharynx is clear and moist.  Left TM not visualized due to presence of either impacted cerum or FB  Eyes: Pupils are equal, round, and reactive to light. EOM are normal.  Neck: Neck supple.  Cardiovascular: Normal rate, regular rhythm and normal heart sounds.  Exam reveals no gallop and no friction rub.   No murmur  heard. Pulmonary/Chest: Effort normal. She has wheezes. She has no rales.  Lymphadenopathy:    She has no cervical adenopathy.  Neurological: She is alert and oriented to person, place, and time. Gait normal.  Skin: Skin is warm and dry.    Results for orders placed or performed during the hospital encounter of 07/27/16  CBC  Result Value Ref Range   WBC 12.8 (H) 4.0 - 10.5 K/uL   RBC 4.22 3.87 - 5.11 MIL/uL   Hemoglobin 10.3 (L) 12.0 - 15.0 g/dL   HCT 16.132.4 (L) 09.636.0 - 04.546.0 %   MCV 76.8 (L) 78.0 - 100.0 fL   MCH 24.4 (L) 26.0 - 34.0 pg   MCHC 31.8 30.0 - 36.0 g/dL   RDW 40.915.4 81.111.5 - 91.415.5 %   Platelets 391 150 - 400 K/uL  RPR  Result Value Ref Range   RPR Ser Ql Non Reactive Non Reactive  CBC  Result Value Ref Range   WBC 18.8 (H) 4.0 - 10.5 K/uL   RBC 3.43 (L) 3.87 - 5.11 MIL/uL   Hemoglobin 8.6 (L) 12.0 - 15.0 g/dL   HCT 78.226.7 (L) 95.636.0 - 21.346.0 %   MCV 77.8 (L) 78.0 - 100.0 fL   MCH 25.1 (L) 26.0 - 34.0 pg   MCHC 32.2 30.0 - 36.0 g/dL   RDW 08.615.7 (H) 57.811.5 - 46.915.5 %   Platelets 313 150 - 400 K/uL  Type and screen [facility]  Result Value Ref Range   ABO/RH(D) O POS    Antibody Screen NEG    Sample Expiration 07/30/2016      ASSESSMENT and PLAN:  1. URI with cough and congestion Discussed supportive measures for URI: increase hydration, rest, OTC medications, etc. RTC precautions discussed.  2. Cold induced bronchospasm Patient with mild wheezing today on exam, she has not been using albuterol, therefore will start there, I discussed with her that currently she does not need oral steroids, but given the upcoming weekend, I will rx prednisone in case needed. Instructions of when to start oral prednisone were discussed. .  - albuterol (PROVENTIL HFA;VENTOLIN HFA) 108 (90 Base) MCG/ACT inhaler; Inhale 2 puffs into the lungs every 6 (six) hours as needed for wheezing or shortness of breath.  Dispense: 1 Inhaler; Refill: 0 - predniSONE (DELTASONE) 20 MG  tablet; Take 2 tablets (40 mg total) by mouth daily with breakfast.  Dispense: 6 tablet; Refill: 0  3. Impacted cerumen of left ear Successful Left ear lavage done by CMA.   Meds ordered this encounter  Medications  . albuterol (PROVENTIL HFA;VENTOLIN HFA) 108 (90 Base) MCG/ACT inhaler    Sig: Inhale 2 puffs into the lungs every 6 (six) hours as needed for wheezing or shortness of breath.    Dispense:  1 Inhaler    Refill:  0  . predniSONE (DELTASONE) 20  MG tablet    Sig: Take 2 tablets (40 mg total) by mouth daily with breakfast.    Dispense:  6 tablet    Refill:  0       Myles Lipps, MD [facility] at [facility] [address] [place], [place] [address] Ph.  [phone] Fax [phone]

## 2017-07-21 ENCOUNTER — Ambulatory Visit (INDEPENDENT_AMBULATORY_CARE_PROVIDER_SITE_OTHER): Payer: Managed Care, Other (non HMO) | Admitting: Family Medicine

## 2017-07-21 ENCOUNTER — Encounter: Payer: Self-pay | Admitting: Family Medicine

## 2017-07-21 VITALS — BP 110/60 | HR 101 | Temp 98.5°F | Resp 16 | Ht 64.0 in | Wt 169.4 lb

## 2017-07-21 DIAGNOSIS — J209 Acute bronchitis, unspecified: Secondary | ICD-10-CM | POA: Diagnosis not present

## 2017-07-21 MED ORDER — AZITHROMYCIN 250 MG PO TABS: ORAL_TABLET | ORAL | 0 refills | Status: DC

## 2017-07-21 MED ORDER — PREDNISONE 20 MG PO TABS: 40.0000 mg | ORAL_TABLET | Freq: Every day | ORAL | 0 refills | Status: AC

## 2017-07-21 MED ORDER — ALBUTEROL SULFATE HFA 108 (90 BASE) MCG/ACT IN AERS: 2.0000 | INHALATION_SPRAY | Freq: Four times a day (QID) | RESPIRATORY_TRACT | 5 refills | Status: DC | PRN

## 2017-07-21 NOTE — Progress Notes (Signed)
10/26/201811:59 AM  Leslie Small [birth year], 31 y.o. female [id number]  Chief Complaint  Patient presents with  . Cough    nonproductive - hurts to cough  -ALL Sxs x 2 days  . Nasal Congestion    blowing green mucus  . Medication Refill    ALBUTEROL IN    HPI:   Patient is a 31 y.o. female who presents today for 2 weeks of worsening nasal congestion, cough, chest tightness, and wheezing. She states that she feels she has mucous to cough up but cant. She has ran out of her albuterol. Ended up needing prednisone when seen in aug 2018, as SOB was worsening despite albuterol use. Again identifies these episodes happen each year end of summer/early fall. Denies any sneezing, itchy watery eyes, itchy ears.throat. She denies any fever or chills, just feels run down.   Depression screen [facility] 2/9 07/21/2017 05/11/2017 07/31/2015  Decreased Interest 0 0 0  Down, Depressed, Hopeless 0 0 0  PHQ - 2 Score 0 0 0    No Known Allergies  Prior to Admission medications   Medication Sig Start Date End Date Taking? Authorizing Provider  albuterol (PROVENTIL HFA;VENTOLIN HFA) 108 (90 Base) MCG/ACT inhaler Inhale 2 puffs into the lungs every 6 (six) hours as needed for wheezing or shortness of breath. 05/11/17  Yes Myles Lipps, MD  ibuprofen (ADVIL,MOTRIN) 800 MG tablet Take 1 tablet (800 mg total) by mouth every 6 (six) hours. 07/28/16  Yes Marlinda Mike, CNM    Past Medical History:  Diagnosis Date  . Anxiety   . Depression   . IBS (irritable bowel syndrome)   . Postpartum care following vaginal delivery (11/1) 07/27/2016    Past Surgical History:  Procedure Laterality Date  . DILATION AND CURETTAGE OF UTERUS     x 3  . WISDOM TOOTH EXTRACTION      Social History  Substance Use Topics  . Smoking status: Current Every Day Smoker    Packs/day: 0.50    Types: Cigarettes  . Smokeless tobacco: Never Used  . Alcohol use 0.0 oz/week     Comment: less than 1 per day    Family  History  Problem Relation Age of Onset  . Breast cancer Paternal Grandmother   . Cervical cancer Cousin   . Prostate cancer Paternal Grandfather   . Colon polyps Paternal Grandfather   . Irritable bowel syndrome Father     ROS Per hpi  OBJECTIVE:  Blood pressure 110/60, pulse (!) 101, temperature 98.5 F (36.9 C), temperature source Oral, resp. rate 16, height 5\' 4"  (1.626 m), weight 169 lb 6.4 oz (76.8 kg), SpO2 98 %, unknown if currently breastfeeding.  Physical Exam  Constitutional: She is oriented to person, place, and time and well-developed, well-nourished, and in no distress.  HENT:  Head: Normocephalic and atraumatic.  Right Ear: Hearing, tympanic membrane, external ear and ear canal normal.  Left Ear: Hearing, tympanic membrane, external ear and ear canal normal.  Mouth/Throat: Oropharynx is clear and moist.  Eyes: Pupils are equal, round, and reactive to light. EOM are normal.  Neck: Neck supple.  Cardiovascular: Normal rate, regular rhythm and normal heart sounds.  Exam reveals no gallop and no friction rub.   No murmur heard. Pulmonary/Chest: Effort normal. She has wheezes. She has rales.  Lymphadenopathy:    She has no cervical adenopathy.  Neurological: She is alert and oriented to person, place, and time. Gait normal.  Skin: Skin is warm and dry.  ASSESSMENT and PLAN 1. Acute bronchitis, unspecified organism Discussed getting a CXR, patient declines at this time due to cost. Treating empirically, Second similar episode this fall. Discussed evaluating for asthma once better. FU in 2 weeks for PFTs, RTC precautions discussed.  - albuterol (PROVENTIL HFA;VENTOLIN HFA) 108 (90 Base) MCG/ACT inhaler; Inhale 2 puffs into the lungs every 6 (six) hours as needed for wheezing or shortness of breath. - predniSONE (DELTASONE) 20 MG tablet; Take 2 tablets (40 mg total) by mouth daily with breakfast. - azithromycin (ZITHROMAX) 250 MG tablet; Take 2 tabs PO on first day,  then 1 tab PO daily   Return in about 2 weeks (around 08/04/2017).    Myles LippsIrma M Santiago, MD [facility] at [facility] [address] [place], [place] [address] Ph.  [phone] Fax [phone]

## 2017-07-21 NOTE — Patient Instructions (Signed)
     IF you received an x-ray today, you will receive an invoice from [facility]. Please contact [facility] at [phone] with questions or concerns regarding your invoice.   IF you received labwork today, you will receive an invoice from [facility]. Please contact [facility] at [phone] with questions or concerns regarding your invoice.   Our billing staff will not be able to assist you with questions regarding bills from these companies.  You will be contacted with the lab results as soon as they are available. The fastest way to get your results is to activate your My Chart account. Instructions are located on the last page of this paperwork. If you have not heard from us regarding the results in 2 weeks, please contact this office.     

## 2017-08-08 ENCOUNTER — Ambulatory Visit (INDEPENDENT_AMBULATORY_CARE_PROVIDER_SITE_OTHER): Payer: Managed Care, Other (non HMO) | Admitting: Family Medicine

## 2017-08-08 ENCOUNTER — Other Ambulatory Visit: Payer: Self-pay

## 2017-08-08 ENCOUNTER — Encounter: Payer: Self-pay | Admitting: Family Medicine

## 2017-08-08 VITALS — BP 110/60 | HR 116 | Temp 98.6°F | Resp 18 | Ht 64.0 in | Wt 173.2 lb

## 2017-08-08 DIAGNOSIS — J4530 Mild persistent asthma, uncomplicated: Secondary | ICD-10-CM

## 2017-08-08 DIAGNOSIS — J302 Other seasonal allergic rhinitis: Secondary | ICD-10-CM | POA: Diagnosis not present

## 2017-08-08 MED ORDER — MONTELUKAST SODIUM 10 MG PO TABS: 10.0000 mg | ORAL_TABLET | Freq: Every day | ORAL | 3 refills | Status: DC

## 2017-08-08 MED ORDER — FLUTICASONE PROPIONATE HFA 44 MCG/ACT IN AERO: 2.0000 | INHALATION_SPRAY | Freq: Two times a day (BID) | RESPIRATORY_TRACT | 3 refills | Status: DC

## 2017-08-08 MED ORDER — TRIAMCINOLONE ACETONIDE 55 MCG/ACT NA AERO: 2.0000 | INHALATION_SPRAY | Freq: Every day | NASAL | 12 refills | Status: DC

## 2017-08-08 NOTE — Patient Instructions (Addendum)
   IF you received an x-ray today, you will receive an invoice from [facility]. Please contact [facility] at [phone] with questions or concerns regarding your invoice.   IF you received labwork today, you will receive an invoice from [facility]. Please contact [facility] at [phone] with questions or concerns regarding your invoice.   Our billing staff will not be able to assist you with questions regarding bills from these companies.  You will be contacted with the lab results as soon as they are available. The fastest way to get your results is to activate your My Chart account. Instructions are located on the last page of this paperwork. If you have not heard from us regarding the results in 2 weeks, please contact this office.     How to Use a Metered Dose Inhaler A metered dose inhaler is a handheld device for taking medicine that must be breathed into the lungs (inhaled). The device can be used to deliver a variety of inhaled medicines, including:  Quick relief or rescue medicines, such as bronchodilators.  Controller medicines, such as corticosteroids.  The medicine is delivered by pushing down on a metal canister to release a preset amount of spray and medicine. Each device contains the amount of medicine that is needed for a preset number of uses (inhalations). Your health care provider may recommend that you use a spacer with your inhaler to help you take the medicine more effectively. A spacer is a plastic tube with a mouthpiece on one end and an opening that connects to the inhaler on the other end. A spacer holds the medicine in a tube for a short time, which allows you to inhale more medicine. What are the risks? If you do not use your inhaler correctly, medicine might not reach your lungs to help you breathe. Inhaler medicine can cause side effects, such as:  Mouth or throat infection.  Cough.  Hoarseness.  Headache.  Nausea and  vomiting.  Lung infection (pneumonia) in people who have a lung condition called COPD.  How to use a metered dose inhaler without a spacer 1. Remove the cap from the inhaler. 2. If you are using the inhaler for the first time, shake it for 5 seconds, turn it away from your face, then release 4 puffs into the air. This is called priming. 3. Shake the inhaler for 5 seconds. 4. Position the inhaler so the top of the canister faces up. 5. Put your index finger on the top of the medicine canister. Support the bottom of the inhaler with your thumb. 6. Breathe out normally and as completely as possible, away from the inhaler. 7. Either place the inhaler between your teeth and close your lips tightly around the mouthpiece, or hold the inhaler 1-2 inches (2.5-5 cm) away from your open mouth. Keep your tongue down out of the way. If you are unsure which technique to use, ask your health care provider. 8. Press the canister down with your index finger to release the medicine, then inhale deeply and slowly through your mouth (not your nose) until your lungs are completely filled. Inhaling should take 4-6 seconds. 9. Hold the medicine in your lungs for 5-10 seconds (10 seconds is best). This helps the medicine get into the small airways of your lungs. 10. With your lips in a tight circle (pursed), breathe out slowly. 11. Repeat steps 3-10 until you have taken the number of puffs that your health care provider directed. Wait about 1 minute   between puffs or as directed. 12. Put the cap on the inhaler. 13. If you are using a steroid inhaler, rinse your mouth with water, gargle, and spit out the water. Do not swallow the water. How to use a metered dose inhaler with a spacer 1. Remove the cap from the inhaler. 2. If you are using the inhaler for the first time, shake it for 5 seconds, turn it away from your face, then release 4 puffs into the air. This is called priming. 3. Shake the inhaler for 5  seconds. 4. Place the open end of the spacer onto the inhaler mouthpiece. 5. Position the inhaler so the top of the canister faces up and the spacer mouthpiece faces you. 6. Put your index finger on the top of the medicine canister. Support the bottom of the inhaler and the spacer with your thumb. 7. Breathe out normally and as completely as possible, away from the spacer. 8. Place the spacer between your teeth and close your lips tightly around it. Keep your tongue down out of the way. 9. Press the canister down with your index finger to release the medicine, then inhale deeply and slowly through your mouth (not your nose) until your lungs are completely filled. Inhaling should take 4-6 seconds. 10. Hold the medicine in your lungs for 5-10 seconds (10 seconds is best). This helps the medicine get into the small airways of your lungs. 11. With your lips in a tight circle (pursed), breathe out slowly. 12. Repeat steps 3-11 until you have taken the number of puffs that your health care provider directed. Wait about 1 minute between puffs or as directed. 13. Remove the spacer from the inhaler and put the cap on the inhaler. 14. If you are using a steroid inhaler, rinse your mouth with water, gargle, and spit out the water. Do not swallow the water. Follow these instructions at home:  Take your inhaled medicine only as told by your health care provider. Do not use the inhaler more than directed by your health care provider.  Keep all follow-up visits as told by your health care provider. This is important.  If your inhaler has a counter, you can check it to determine how full your inhaler is. If your inhaler does not have a counter, ask your health care provider when you will need to refill your inhaler and write the refill date on a calendar or on your inhaler canister. Note that you cannot know when an inhaler is empty by shaking it.  Follow directions on the package insert for care and cleaning of  your inhaler and spacer. Contact a health care provider if:  Symptoms are only partially relieved with your inhaler.  You are having trouble using your inhaler.  You have an increase in phlegm.  You have headaches. Get help right away if:  You feel little or no relief after using your inhaler.  You have dizziness.  You have a fast heart rate.  You have chills or a fever.  You have night sweats.  There is blood in your phlegm. Summary  A metered dose inhaler is a handheld device for taking medicine that must be breathed into the lungs (inhaled).  The medicine is delivered by pushing down on a metal canister to release a preset amount of spray and medicine.  Each device contains the amount of medicine that is needed for a preset number of uses (inhalations). This information is not intended to replace advice given to you   by your health care provider. Make sure you discuss any questions you have with your health care provider. Document Released: 09/12/2005 Document Revised: 08/02/2016 Document Reviewed: 08/02/2016 Elsevier Interactive Patient Education  2017 Elsevier Inc.  

## 2017-08-08 NOTE — Progress Notes (Signed)
11/13/20183:21 PM  Leslie Small 14-Apr-1986, 31 y.o. female [id number]  Chief Complaint  Patient presents with  . Bronchitis    follow up pt states that thursday starting getting sick all over again     HPI:   Patient is a 31 y.o. female with past medical history significant for recurring bronchitis, past smoker who presents today for return of similar symptoms: nasal congestion, cough, chest tightness, SOB and wheezing. She had just completed acute treatment less than a week ago. She is using her albuterol several times a day. She denies any fever or chills.   [facility] 08/08/2017 07/21/2017 05/11/2017  Decreased Interest 0 0 0  Down, Depressed, Hopeless 0 0 0  PHQ - 2 Score 0 0 0    No Known Allergies  Prior to Admission medications   Medication Sig Start Date End Date Taking? Authorizing Provider  albuterol (PROVENTIL HFA;VENTOLIN HFA) 108 (90 Base) MCG/ACT inhaler Inhale 2 puffs into the lungs every 6 (six) hours as needed for wheezing or shortness of breath. Patient not taking: Reported on 08/08/2017 07/21/17   Myles LippsSantiago, [place] Riesen M, MD  ibuprofen (ADVIL,MOTRIN) 800 MG tablet Take 1 tablet (800 mg total) by mouth every 6 (six) hours. Patient not taking: Reported on 08/08/2017 07/28/16   Marlinda MikeBailey, Tanya, CNM    Past Medical History:  Diagnosis Date  . Anxiety   . Depression   . IBS (irritable bowel syndrome)   . Postpartum care following vaginal delivery (11/1) 07/27/2016    Past Surgical History:  Procedure Laterality Date  . DILATION AND CURETTAGE OF UTERUS     x 3  . WISDOM TOOTH EXTRACTION      Social History   Tobacco Use  . Smoking status: Former Smoker    Packs/day: 0.50    Types: Cigarettes  . Smokeless tobacco: Never Used  Substance Use Topics  . Alcohol use: Yes    Alcohol/week: 0.0 oz    Comment: less than 1 per day    Family History  Problem Relation Age of Onset  . Breast cancer Paternal Grandmother   . Cervical cancer Cousin     . Prostate cancer Paternal Grandfather   . Colon polyps Paternal Grandfather   . Irritable bowel syndrome Father     ROS Per HPI  OBJECTIVE:  Blood pressure 110/60, pulse (!) 116, temperature 98.6 F (37 C), temperature source Oral, resp. rate 18, height 5\' 4"  (1.626 m), weight 173 lb 3.2 oz (78.6 kg), SpO2 95 %, unknown if currently breastfeeding.  Physical Exam  Constitutional: She is oriented to person, place, and time and well-developed, well-nourished, and in no distress.  HENT:  Head: Normocephalic and atraumatic.  Right Ear: Hearing, tympanic membrane, external ear and ear canal normal.  Left Ear: Hearing, tympanic membrane, external ear and ear canal normal.  Mouth/Throat: Oropharynx is clear and moist.  Eyes: EOM are normal. Pupils are equal, round, and reactive to light.  Neck: Neck supple.  Cardiovascular: Normal rate, regular rhythm and normal heart sounds. Exam reveals no gallop and no friction rub.  No murmur heard. Pulmonary/Chest: Effort normal. She has wheezes. She has no rales.  Lymphadenopathy:    She has no cervical adenopathy.  Neurological: She is alert and oriented to person, place, and time. Gait normal.  Skin: Skin is warm and dry.     ASSESSMENT and PLAN  1. Mild persistent asthma, unspecified whether complicated Discussed supportive measures, new meds r/se/b and RTC precautions. Patient educational handout  given. - fluticasone (FLOVENT HFA) 44 MCG/ACT inhaler; Inhale 2 puffs 2 (two) times daily into the lungs.  2. Seasonal allergic rhinitis, unspecified trigger - triamcinolone (NASACORT) 55 MCG/ACT AERO nasal inhaler; Place 2 sprays daily into the nose. - montelukast (SINGULAIR) 10 MG tablet; Take 1 tablet (10 mg total) at bedtime by mouth.   Return in about 1 week (around 08/15/2017).    Myles LippsIrma M Santiago, MD [facility] at [facility] [address] [place], [place] [address] Ph.  [phone] Fax [phone]

## 2017-08-15 ENCOUNTER — Encounter: Payer: Self-pay | Admitting: Family Medicine

## 2017-08-15 ENCOUNTER — Ambulatory Visit (INDEPENDENT_AMBULATORY_CARE_PROVIDER_SITE_OTHER): Payer: Managed Care, Other (non HMO) | Admitting: Family Medicine

## 2017-08-15 ENCOUNTER — Other Ambulatory Visit: Payer: Self-pay

## 2017-08-15 VITALS — BP 110/60 | HR 90 | Temp 98.4°F | Resp 18 | Ht 64.76 in | Wt 172.6 lb

## 2017-08-15 DIAGNOSIS — J302 Other seasonal allergic rhinitis: Secondary | ICD-10-CM

## 2017-08-15 DIAGNOSIS — J301 Allergic rhinitis due to pollen: Secondary | ICD-10-CM | POA: Insufficient documentation

## 2017-08-15 DIAGNOSIS — J4530 Mild persistent asthma, uncomplicated: Secondary | ICD-10-CM | POA: Diagnosis not present

## 2017-08-15 LAB — PULMONARY FUNCTION TEST

## 2017-08-15 MED ORDER — ALBUTEROL SULFATE (2.5 MG/3ML) 0.083% IN NEBU
2.5000 mg | INHALATION_SOLUTION | Freq: Once | RESPIRATORY_TRACT | Status: AC
  Administered 2017-08-15: 2.5 mg via RESPIRATORY_TRACT

## 2017-08-15 NOTE — Patient Instructions (Signed)
     IF you received an x-ray today, you will receive an invoice from [facility]. Please contact [facility] at [phone] with questions or concerns regarding your invoice.   IF you received labwork today, you will receive an invoice from [facility]. Please contact [facility] at [phone] with questions or concerns regarding your invoice.   Our billing staff will not be able to assist you with questions regarding bills from these companies.  You will be contacted with the lab results as soon as they are available. The fastest way to get your results is to activate your My Chart account. Instructions are located on the last page of this paperwork. If you have not heard from us regarding the results in 2 weeks, please contact this office.     

## 2017-08-15 NOTE — Progress Notes (Signed)
11/20/201812:31 PM  Leslie BlossomSarah L Anna 08-30-1986, 31 y.o. female [id number]  Chief Complaint  Patient presents with  . Asthma    f/u  . Sinus Problem    HPI:   Patient is a 31 y.o. female who presents today for asthma and allergies.  Started on low dose ICS, she has been doing 1 puff BID, feeling better, not as tight, has not started on nasal steroid as too costly, has used albuterol several times last week, still having trouble going up 3 flights of stairs to her home.  Has no acute concerns today.  [facility] 08/15/2017 08/08/2017 07/21/2017  Decreased Interest 0 0 0  Down, Depressed, Hopeless 0 0 0  PHQ - 2 Score 0 0 0    No Known Allergies  Prior to Admission medications   Medication Sig Start Date End Date Taking? Authorizing Provider  albuterol (PROVENTIL HFA;VENTOLIN HFA) 108 (90 Base) MCG/ACT inhaler Inhale 2 puffs into the lungs every 6 (six) hours as needed for wheezing or shortness of breath. 07/21/17  Yes Myles LippsSantiago, Irma M, MD  fluticasone (FLOVENT HFA) 44 MCG/ACT inhaler Inhale 2 puffs 2 (two) times daily into the lungs. 08/08/17  Yes Myles LippsSantiago, Irma M, MD  ibuprofen (ADVIL,MOTRIN) 800 MG tablet Take 1 tablet (800 mg total) by mouth every 6 (six) hours. 07/28/16  Yes Marlinda MikeBailey, Tanya, CNM  montelukast (SINGULAIR) 10 MG tablet Take 1 tablet (10 mg total) at bedtime by mouth. 08/08/17  Yes Myles LippsSantiago, Irma M, MD  triamcinolone (NASACORT) 55 MCG/ACT AERO nasal inhaler Place 2 sprays daily into the nose. 08/08/17  Yes Myles LippsSantiago, Irma M, MD    Past Medical History:  Diagnosis Date  . Anxiety   . Depression   . IBS (irritable bowel syndrome)   . Postpartum care following vaginal delivery (11/1) 07/27/2016    Past Surgical History:  Procedure Laterality Date  . DILATION AND CURETTAGE OF UTERUS     x 3  . WISDOM TOOTH EXTRACTION      Social History   Tobacco Use  . Smoking status: Former Smoker    Packs/day: 0.50    Types: Cigarettes  . Smokeless  tobacco: Never Used  Substance Use Topics  . Alcohol use: Yes    Alcohol/week: 0.0 oz    Comment: less than 1 per day    Family History  Problem Relation Age of Onset  . Breast cancer Paternal Grandmother   . Cervical cancer Cousin   . Prostate cancer Paternal Grandfather   . Colon polyps Paternal Grandfather   . Irritable bowel syndrome Father     ROS Per hpi  OBJECTIVE:  Blood pressure 110/60, pulse 90, temperature 98.4 F (36.9 C), temperature source Oral, resp. rate 18, height 5' 4.76" (1.645 m), weight 172 lb 9.6 oz (78.3 kg), SpO2 97 %, not currently breastfeeding.  Physical Exam  Constitutional: She is oriented to person, place, and time and well-developed, well-nourished, and in no distress.  HENT:  Head: Normocephalic and atraumatic.  Mouth/Throat: Oropharynx is clear and moist. No oropharyngeal exudate.  Eyes: EOM are normal. Pupils are equal, round, and reactive to light. No scleral icterus.  Neck: Neck supple.  Cardiovascular: Normal rate, regular rhythm and normal heart sounds. Exam reveals no gallop and no friction rub.  No murmur heard. Pulmonary/Chest: Effort normal and breath sounds normal. She has no wheezes. She has no rales.  Musculoskeletal: She exhibits no edema.  Neurological: She is alert and oriented to person, place, and time. Gait  normal.  Skin: Skin is warm and dry.    Office Spirometry Results: FEV1: 2.68 liters(post albuterol treatment) FVC: 3.75 liters FEV1/FVC: 71.5 % FVC  % Predicted: 5 %(change) FEV % Predicted: 11 %(change) FeF 25-75: 3.76 liters FeF 25-75 % Predicted: 49  ASSESSMENT and PLAN  1. Mild persistent asthma, unspecified whether complicated - Improved, spirometry = mild obstruction. Discussed titration of ICS to 2 puff BID in the next 2 weeks if still symptomatic.  - albuterol (PROVENTIL) (2.5 MG/3ML) 0.083% nebulizer solution 2.5 mg  2. Seasonal allergic rhinitis, unspecified trigger Discussed OTC nasacort.  Return  in about 4 weeks (around 09/12/2017) for asthma, shave biospy.    Myles LippsIrma M Santiago, MD [facility] at [facility] [address] [place], [place] [address] Ph.  [phone] Fax [phone]

## 2017-09-12 ENCOUNTER — Other Ambulatory Visit: Payer: Self-pay

## 2017-09-12 ENCOUNTER — Encounter: Payer: Self-pay | Admitting: Family Medicine

## 2017-09-12 ENCOUNTER — Ambulatory Visit (INDEPENDENT_AMBULATORY_CARE_PROVIDER_SITE_OTHER): Payer: Managed Care, Other (non HMO) | Admitting: Family Medicine

## 2017-09-12 VITALS — BP 122/88 | HR 115 | Temp 99.0°F | Wt 178.2 lb

## 2017-09-12 DIAGNOSIS — L989 Disorder of the skin and subcutaneous tissue, unspecified: Secondary | ICD-10-CM | POA: Diagnosis not present

## 2017-09-12 DIAGNOSIS — J4530 Mild persistent asthma, uncomplicated: Secondary | ICD-10-CM | POA: Diagnosis not present

## 2017-09-12 NOTE — Progress Notes (Signed)
12/18/20189:30 AM  Leslie BlossomSarah L Small 1986-09-24, 31 y.o. female [id number]  Chief Complaint  Patient presents with  . Follow-up    POSSIBLE ASTHMA DX PER LAST VISIT. ASLO, HAS A GROETH ON THE SIDE OF THE NECK AREA THAT WILL BE GETTING REMOVED TODAY    HPI:   Patient is a 31 y.o. female with past medical history significant for asthma who presents today for follow-up.  Not doing much better on higher dose of ICS, still using albuterol about twice a day for cough with chest tightness. Worse in the morning, Patient has stopped smoking. Using allergy meds as prescribed. Using humidifier, Has dusted every fan and vent in the home.   She also would like to have a skin lesion removed, it has been present for years, along her right posterior hairline, postmastoid area. It has not been growing but it always gets caught when she brushes her hair.  [facility] 2[facility] 09/12/2017 08/15/2017 08/08/2017  Decreased Interest 0 0 0  Down, Depressed, Hopeless 0 0 0  PHQ - 2 Score 0 0 0    No Known Allergies  Prior to Admission medications   Medication Sig Start Date End Date Taking? Authorizing Provider  albuterol (PROVENTIL HFA;VENTOLIN HFA) 108 (90 Base) MCG/ACT inhaler Inhale 2 puffs into the lungs every 6 (six) hours as needed for wheezing or shortness of breath. 07/21/17  Yes Myles LippsSantiago, Carolle Ishii M, MD  fluticasone (FLOVENT HFA) 44 MCG/ACT inhaler Inhale 2 puffs 2 (two) times daily into the lungs. 08/08/17  Yes Myles LippsSantiago, Kortney Schoenfelder M, MD  montelukast (SINGULAIR) 10 MG tablet Take 1 tablet (10 mg total) at bedtime by mouth. 08/08/17  Yes Myles LippsSantiago, Nadelyn Enriques M, MD  triamcinolone (NASACORT) 55 MCG/ACT AERO nasal inhaler Place 2 sprays daily into the nose. 08/08/17  Yes Myles LippsSantiago, Brihany Butch M, MD  ibuprofen (ADVIL,MOTRIN) 800 MG tablet Take 1 tablet (800 mg total) by mouth every 6 (six) hours. Patient not taking: Reported on 09/12/2017 07/28/16   Marlinda MikeBailey, Tanya, CNM    Past Medical History:  Diagnosis Date  .  Anxiety   . Depression   . IBS (irritable bowel syndrome)   . Postpartum care following vaginal delivery (11/1) 07/27/2016    Past Surgical History:  Procedure Laterality Date  . DILATION AND CURETTAGE OF UTERUS     x 3  . WISDOM TOOTH EXTRACTION      Social History   Tobacco Use  . Smoking status: Former Smoker    Packs/day: 0.50    Types: Cigarettes  . Smokeless tobacco: Never Used  Substance Use Topics  . Alcohol use: Yes    Alcohol/week: 0.0 oz    Comment: less than 1 per day    Family History  Problem Relation Age of Onset  . Breast cancer Paternal Grandmother   . Cervical cancer Cousin   . Prostate cancer Paternal Grandfather   . Colon polyps Paternal Grandfather   . Irritable bowel syndrome Father     ROS Per hpi  OBJECTIVE:  Blood pressure 122/88, pulse (!) 115, temperature 99 F (37.2 C), temperature source Oral, weight 178 lb 3.2 oz (80.8 kg), SpO2 98 %, not currently breastfeeding.  Physical Exam  Gen: AAOx3, NAD Resp: CTAB no w/r/r CV: RRR no m/r/g Skin: a 3 mm soft fleshy colored pedunculated with verrucous appearance papule along right mastoid area.   Procedure Note: Verbal informed consent obtained. Area cleansed with iodine x 3. 1cc of 1% lidocaine with epi used for anesthesia. Shave biospy performed  with dermablade. Good homeostasis obtained with silver nitrate. Wound dressed. RTC wound care and precautions given. Patient tolerated procedure well. Lesion sent to derm path.    ASSESSMENT and PLAN 1. Skin lesion On exam suggestive of intradermal nevi. - Dermatology pathology  2. Mild persistent asthma without complication Patient still with significant albuterol use. Referring to pulm for further eval and treatment.  - Ambulatory referral to Pulmonology  Return for after pulm.    Myles LippsIrma M Santiago, MD [facility] [address] [place], [place] [address] Ph.  [phone] Fax [phone]

## 2017-09-12 NOTE — Patient Instructions (Signed)
     IF you received an x-ray today, you will receive an invoice from [facility]. Please contact [facility] at [phone] with questions or concerns regarding your invoice.   IF you received labwork today, you will receive an invoice from [facility]. Please contact [facility] at [phone] with questions or concerns regarding your invoice.   Our billing staff will not be able to assist you with questions regarding bills from these companies.  You will be contacted with the lab results as soon as they are available. The fastest way to get your results is to activate your My Chart account. Instructions are located on the last page of this paperwork. If you have not heard from us regarding the results in 2 weeks, please contact this office.     

## 2017-11-02 ENCOUNTER — Other Ambulatory Visit: Payer: Self-pay | Admitting: Family Medicine

## 2017-11-02 NOTE — Telephone Encounter (Signed)
Copied from CRM [id number]. Topic: General - Other >> Nov 02, 2017 12:56 PM Raquel SarnaHayes, Teresa G wrote: Montelukast  10 mg   4 left- pt needing refills   [facility] [employer] [address] - [place], [place] - [address] [place] [address] Phone: [phone] Fax: [phone]

## 2017-11-02 NOTE — Telephone Encounter (Signed)
Called pt regarding her request for montelukast; she says her bottle says "no refills" but prescription dated 08/08/18 has 3 refills; contacted  at [facility] and spoke with April; she states that the she has prescription sent in today; will notify pt; also pt said that she did not follow up with pulmonary because she has been feeling better; will notify [place].

## 2017-11-02 NOTE — Telephone Encounter (Signed)
Notified pt that her [facility] has been [facility] to [facility] [address] [place], [place]

## 2018-01-27 ENCOUNTER — Other Ambulatory Visit: Payer: Self-pay

## 2018-01-27 ENCOUNTER — Other Ambulatory Visit: Payer: Self-pay | Admitting: Family Medicine

## 2018-03-23 ENCOUNTER — Other Ambulatory Visit: Payer: Self-pay | Admitting: Family Medicine

## 2018-04-20 ENCOUNTER — Other Ambulatory Visit: Payer: Self-pay | Admitting: Family Medicine

## 2018-05-21 ENCOUNTER — Other Ambulatory Visit: Payer: Self-pay | Admitting: Family Medicine

## 2018-05-22 ENCOUNTER — Other Ambulatory Visit: Payer: Self-pay | Admitting: Family Medicine

## 2018-06-18 ENCOUNTER — Other Ambulatory Visit: Payer: Self-pay | Admitting: Family Medicine

## 2018-07-16 ENCOUNTER — Other Ambulatory Visit: Payer: Self-pay | Admitting: Family Medicine

## 2018-07-16 NOTE — Telephone Encounter (Signed)
Requested Prescriptions  Pending Prescriptions Disp Refills  . montelukast (SINGULAIR) 10 MG tablet [Pharmacy Med Name: MONTELUKAST SOD 10 MG TABLET] 60 tablet 0    Sig: TAKE ONE TABLET BY MOUTH EVERY NIGHT AT BEDTIME     Pulmonology:  Leukotriene Inhibitors Passed - 07/16/2018  6:13 AM      Passed - Valid encounter within last 12 months    Recent Outpatient Visits          10 months ago Skin lesion   Primary Care at [facility], Meda Coffee, MD   11 months ago Mild persistent asthma, unspecified whether complicated   [facility] at [facility], Meda Coffee, MD   11 months ago Mild persistent asthma, unspecified whether complicated   [facility] at [facility], Meda Coffee, MD   12 months ago Acute bronchitis, unspecified organism   [facility] at [facility], Meda Coffee, MD   1 year ago URI with cough and congestion   [facility] Care at [facility], Meda Coffee, MD

## 2018-09-16 ENCOUNTER — Other Ambulatory Visit: Payer: Self-pay | Admitting: Family Medicine

## 2018-09-17 ENCOUNTER — Other Ambulatory Visit: Payer: Self-pay | Admitting: *Deleted

## 2018-09-17 ENCOUNTER — Ambulatory Visit: Payer: Self-pay | Admitting: *Deleted

## 2018-09-17 MED ORDER — MONTELUKAST SODIUM 10 MG PO TABS: 10.0000 mg | ORAL_TABLET | Freq: Every day | ORAL | 0 refills | Status: DC

## 2018-09-17 NOTE — Telephone Encounter (Signed)
  Reason for Disposition . [1] Request for URGENT new prescription or refill of "essential" medication (i.e., likelihood of harm to patient if not taken) AND [2] triager unable to fill per unit policy  Answer Assessment - Initial Assessment Questions 1. SYMPTOMS: "Do you have any symptoms?"     Pt states she has asthma and is currently taking Singulair and can tel when she misses a dose.  Protocols used: MEDICATION QUESTION CALL-A-AH

## 2018-09-17 NOTE — Telephone Encounter (Signed)
Pt returning call to office after being notified that appt was needed in order to continue to receive refills of Montelukast (Singulair) 10 mg tablet. Pt notified that it has been about a year since she has been in the office and in order to continue to receive refills she would need to be scheduled for an appt. Pt states that she doesn't have the money to come in to get an appt. Pt states that "we are drowning" and doesn't know when she will be able to afford to come in for an appt. Pt states she can tell when she forgets to take it and asthma starts to flare up and she feels like her chest becomes tight. Pt advised that if she she becomes worse to seek treatment in the ED if she is unable to afford an appt Pt notified that request will be sent to Dr. Leretha PolSantiago to see if refills would be granted.

## 2018-09-17 NOTE — Telephone Encounter (Signed)
One month called in Needs an appointment

## 2018-09-17 NOTE — Telephone Encounter (Signed)
Also see triage note from 09/17/18

## 2018-09-25 ENCOUNTER — Encounter (HOSPITAL_COMMUNITY): Payer: Self-pay | Admitting: Emergency Medicine

## 2018-09-25 ENCOUNTER — Ambulatory Visit (HOSPITAL_COMMUNITY)
Admission: EM | Admit: 2018-09-25 | Discharge: 2018-09-25 | Disposition: A | Discharge status: Home / Self Care | Payer: Managed Care, Other (non HMO) | Attending: Family Medicine | Admitting: Family Medicine

## 2018-09-25 DIAGNOSIS — L600 Ingrowing nail: Secondary | ICD-10-CM

## 2018-09-25 DIAGNOSIS — L0889 Other specified local infections of the skin and subcutaneous tissue: Secondary | ICD-10-CM

## 2018-09-25 DIAGNOSIS — L03031 Cellulitis of right toe: Secondary | ICD-10-CM | POA: Insufficient documentation

## 2018-09-25 MED ORDER — CEPHALEXIN 500 MG PO CAPS: 500.0000 mg | ORAL_CAPSULE | Freq: Four times a day (QID) | ORAL | 0 refills | Status: DC

## 2018-09-25 MED ORDER — IBUPROFEN 600 MG PO TABS
600.0000 mg | ORAL_TABLET | Freq: Once | ORAL | Status: AC
  Administered 2018-09-25: 600 mg via ORAL

## 2018-09-25 MED ORDER — LIDOCAINE HCL (PF) 1 % IJ SOLN
5.0000 mL | Freq: Once | INTRAMUSCULAR | Status: AC
  Administered 2018-09-25: 5 mL

## 2018-09-25 MED ORDER — IBUPROFEN 100 MG/5ML PO SUSP
ORAL | Status: AC
  Filled 2018-09-25: qty 30

## 2018-09-25 MED ORDER — BACITRACIN ZINC 500 UNIT/GM EX OINT: TOPICAL_OINTMENT | Freq: Once | CUTANEOUS | Status: DC

## 2018-09-25 NOTE — ED Triage Notes (Signed)
Pt here for left sided ingrown toenail

## 2018-09-25 NOTE — ED Provider Notes (Signed)
[facility]    CSN: [id number] Arrival date & time: 09/25/18  1457     History   Chief Complaint Chief Complaint  Patient presents with  . Toe Pain    appt 1530    HPI Leslie Small is a 32 y.o. female who presents to the UC for an ingrown nail. Patient reports she has been picking and probing at the area for a few days and it has gotten worse. The area is located on the lateral aspect of the right great toe.   HPI  Past Medical History:  Diagnosis Date  . Anxiety   . Depression   . IBS (irritable bowel syndrome)   . Postpartum care following vaginal delivery (11/1) 07/27/2016    Patient Active Problem List   Diagnosis Date Noted  . Mild persistent asthma 08/15/2017  . Seasonal allergic rhinitis 08/15/2017    Past Surgical History:  Procedure Laterality Date  . DILATION AND CURETTAGE OF UTERUS     x 3  . WISDOM TOOTH EXTRACTION      OB History    Gravida  4   Para  2   Term  2   Preterm      AB  2   Living  2     SAB  2   TAB      Ectopic      Multiple  0   Live Births  2            Home Medications    Prior to Admission medications   Medication Sig Start Date End Date Taking? Authorizing Provider  albuterol (PROVENTIL HFA;VENTOLIN HFA) 108 (90 Base) MCG/ACT inhaler Inhale 2 puffs into the lungs every 6 (six) hours as needed for wheezing or shortness of breath. 07/21/17   Myles Lipps, MD  cephALEXin (KEFLEX) 500 MG capsule Take 1 capsule (500 mg total) by mouth 4 (four) times daily. 09/25/18   Janne Napoleon, NP  fluticasone (FLOVENT HFA) 44 MCG/ACT inhaler Inhale 2 puffs 2 (two) times daily into the lungs. 08/08/17   Myles Lipps, MD  ibuprofen (ADVIL,MOTRIN) 800 MG tablet Take 1 tablet (800 mg total) by mouth every 6 (six) hours. Patient not taking: Reported on 09/12/2017 07/28/16   Marlinda Mike, CNM  montelukast (SINGULAIR) 10 MG tablet Take 1 tablet (10 mg total) by mouth at bedtime. 09/17/18   Myles Lipps,  MD  triamcinolone (NASACORT) 55 MCG/ACT AERO nasal inhaler Place 2 sprays daily into the nose. 08/08/17   Myles Lipps, MD    Family History Family History  Problem Relation Age of Onset  . Breast cancer Paternal Grandmother   . Cervical cancer Cousin   . Prostate cancer Paternal Grandfather   . Colon polyps Paternal Grandfather   . Irritable bowel syndrome Father     Social History Social History   Tobacco Use  . Smoking status: Former Smoker    Packs/day: 0.50    Types: Cigarettes  . Smokeless tobacco: Never Used  Substance Use Topics  . Alcohol use: Yes    Alcohol/week: 0.0 standard drinks    Comment: less than 1 per day  . Drug use: No     Allergies   Wellbutrin [bupropion]   Review of Systems Review of Systems  Musculoskeletal: Positive for arthralgias.  Skin: Positive for wound.  All other systems reviewed and are negative.    Physical Exam Triage Vital Signs ED Triage Vitals  Enc Vitals Group  BP      Pulse      Resp      Temp      Temp src      SpO2      Weight      Height      Head Circumference      Peak Flow      Pain Score      Pain Loc      Pain Edu?      Excl. in GC?    No data found.  Updated Vital Signs BP 138/83 (BP Location: Left Arm)   Pulse (!) 107   Temp 98.5 F (36.9 C) (Oral)   Resp 18   SpO2 100%  Physical Exam Vitals signs and nursing note reviewed.  Constitutional:      General: She is not in acute distress.    Appearance: She is well-developed.  HENT:     Head: Normocephalic.     Nose: Nose normal.  Eyes:     Conjunctiva/sclera: Conjunctivae normal.  Neck:     Musculoskeletal: Neck supple.  Cardiovascular:     Rate and Rhythm: Tachycardia present.  Pulmonary:     Effort: Pulmonary effort is normal.  Abdominal:     Palpations: Abdomen is soft.     Tenderness: There is no abdominal tenderness.  Musculoskeletal: Normal range of motion.     Comments: Right great toe with infection. Nail turning  inward on the lateral aspect with surrounding erythema. No red streaking.   Skin:    General: Skin is warm and dry.  Neurological:     Mental Status: She is alert and oriented to person, place, and time.  Psychiatric:        Mood and Affect: Mood normal.      UC Treatments / Results  Labs (all labs ordered are listed, but only abnormal results are displayed) Labs Reviewed - No data to display  Radiology No results found.  Procedures Excise Ingrown Toenail Date/Time: 09/25/2018 4:27 PM Performed by: Janne NapoleonNeese, Melbert Botelho M, NP Authorized by: Mardella LaymanHagler, Brian, MD   Consent:    Consent obtained:  Verbal   Consent given by:  Patient   Risks discussed:  Bleeding and incomplete removal   Alternatives discussed:  Alternative treatment Location:    Foot:  R big toe Pre-procedure details:    Skin preparation:  Betadine   Preparation: Patient was prepped and draped in the usual sterile fashion   Anesthesia (see MAR for exact dosages):    Anesthesia method:  Nerve block   Block location:  Right great toe   Block needle gauge:  25 G   Block anesthetic:  Lidocaine 2% w/o epi   Block injection procedure:  Anatomic landmarks identified, introduced needle, incremental injection, anatomic landmarks palpated and negative aspiration for blood   Block outcome:  Anesthesia achieved Nail Removal:    Nail removed:  Partial   Nail side:  Lateral Ingrown nail:    Wedge excision of skin: yes     Nail matrix removed or ablated:  Partial Post-procedure details:    Dressing:  Non-adhesive packing strip and gauze roll   Patient tolerance of procedure:  Tolerated well, no immediate complications   (including critical care time)  Medications Ordered in UC Medications  bacitracin ointment (has no administration in time range)  lidocaine (PF) (XYLOCAINE) 1 % injection 5 mL (5 mLs Infiltration Given by Other 09/25/18 1535)  ibuprofen (ADVIL,MOTRIN) tablet 600 mg (600 mg Oral Given 09/25/18  1635)     Initial Impression / Assessment and Plan / UC Course  I have reviewed the triage vital signs and the nursing notes. 32 y.o. female here with infected ingrown toenail of the right great toe stable for d/c without fever or red streaking. Discussed with the patient plan of care and she agrees with plan.  Final Clinical Impressions(s) / UC Diagnoses   Final diagnoses:  Infection of nail bed of toe of right foot     Discharge Instructions     Take ibuprofen as needed for pain. Soak the foot in warm water several times a day.. Or "take a long bath"!!    ED Prescriptions    Medication Sig Dispense Auth. Provider   cephALEXin (KEFLEX) 500 MG capsule Take 1 capsule (500 mg total) by mouth 4 (four) times daily. 20 capsule Janne NapoleonNeese, Mallie Linnemann M, NP     Controlled Substance Prescriptions [place] Controlled Substance Registry consulted? Not Applicable   Janne Napoleoneese, Dartha Rozzell M, [place] 09/25/18 [phone]

## 2018-09-25 NOTE — Discharge Instructions (Addendum)
Take ibuprofen as needed for pain. Soak the foot in warm water several times a day.. Or "take a long bath"!!

## 2018-09-27 DIAGNOSIS — Z30430 Encounter for insertion of intrauterine contraceptive device: Secondary | ICD-10-CM | POA: Diagnosis not present

## 2018-10-25 DIAGNOSIS — Z30431 Encounter for routine checking of intrauterine contraceptive device: Secondary | ICD-10-CM | POA: Diagnosis not present

## 2018-10-25 DIAGNOSIS — F419 Anxiety disorder, unspecified: Secondary | ICD-10-CM | POA: Diagnosis not present

## 2018-11-18 ENCOUNTER — Other Ambulatory Visit: Payer: Self-pay | Admitting: Family Medicine

## 2018-11-19 NOTE — Telephone Encounter (Signed)
Courtesy refill until appointment 12/25/18

## 2018-11-19 NOTE — Telephone Encounter (Signed)
Patient called and advised she will need an OV in order to receive refills, she verbalized understanding. Appointment scheduled for Tuesday, 12/25/18 at 1040. She says she will be out of Singulair before the appointment, advised a 30 day supply will be sent to cover until the appointment, she verbalized understanding that she will need to come to the appointment.

## 2018-12-17 ENCOUNTER — Other Ambulatory Visit: Payer: Self-pay | Admitting: Family Medicine

## 2018-12-17 NOTE — Telephone Encounter (Signed)
8 pills refilled to get pt to appointment Requested Prescriptions  Pending Prescriptions Disp Refills  . montelukast (SINGULAIR) 10 MG tablet [Pharmacy Med Name: MONTELUKAST SOD 10 MG TABLET] 8 tablet 0    Sig: TAKE ONE TABLET BY MOUTH EVERY NIGHT AT BEDTIME  (MUST KEEP APPOINTMENT ON 12/25/2018 FOR REFILLS)     Pulmonology:  Leukotriene Inhibitors Failed - 12/17/2018  6:13 AM      Failed - Valid encounter within last 12 months    Recent Outpatient Visits          1 year ago Skin lesion   [facility] at [facility], Meda Coffee, MD   1 year ago Mild persistent asthma, unspecified whether complicated   [facility] at [facility], Meda Coffee, MD   1 year ago Mild persistent asthma, unspecified whether complicated   [facility] at [facility], Meda Coffee, MD   1 year ago Acute bronchitis, unspecified organism   [facility], Meda Coffee, MD   1 year ago URI with cough and congestion   [facility] at [facility], Meda Coffee, MD      Future Appointments            In 1 week Myles Lipps, MD Primary Care at [facility] [place]

## 2018-12-18 ENCOUNTER — Encounter: Payer: Self-pay | Admitting: Family Medicine

## 2018-12-25 ENCOUNTER — Other Ambulatory Visit: Payer: Self-pay

## 2018-12-25 ENCOUNTER — Telehealth (INDEPENDENT_AMBULATORY_CARE_PROVIDER_SITE_OTHER): Payer: BC Managed Care – PPO | Admitting: Family Medicine

## 2018-12-25 DIAGNOSIS — J4530 Mild persistent asthma, uncomplicated: Secondary | ICD-10-CM | POA: Diagnosis not present

## 2018-12-25 DIAGNOSIS — J302 Other seasonal allergic rhinitis: Secondary | ICD-10-CM

## 2018-12-25 MED ORDER — FLUTICASONE PROPIONATE 50 MCG/ACT NA SUSP: 1.0000 | Freq: Two times a day (BID) | NASAL | 6 refills | Status: DC

## 2018-12-25 MED ORDER — MONTELUKAST SODIUM 10 MG PO TABS: 10.0000 mg | ORAL_TABLET | Freq: Every day | ORAL | 5 refills | Status: DC

## 2018-12-25 MED ORDER — FLUTICASONE PROPIONATE HFA 44 MCG/ACT IN AERO: 2.0000 | INHALATION_SPRAY | Freq: Two times a day (BID) | RESPIRATORY_TRACT | 3 refills | Status: DC

## 2018-12-25 MED ORDER — ALBUTEROL SULFATE HFA 108 (90 BASE) MCG/ACT IN AERS: 2.0000 | INHALATION_SPRAY | Freq: Four times a day (QID) | RESPIRATORY_TRACT | 5 refills | Status: DC | PRN

## 2018-12-25 NOTE — Progress Notes (Signed)
Needs refills on albuterol and singular only

## 2018-12-25 NOTE — Progress Notes (Signed)
Virtual Visit via telephone Note  I connected with patient on 12/25/18 at 1140 by telephone and verified that I am speaking with the correct person using two identifiers. Leslie Small is currently located at home and patient is currently with her during visit. The provider, Myles Lipps, MD is located in their office at time of visit.  I discussed the limitations, risks, security and privacy concerns of performing an evaluation and management service by telephone and the availability of in person appointments. I also discussed with the patient that there may be a patient responsible charge related to this service. The patient expressed understanding and agreed to proceed.   Telephone visit today for medication refills  HPI  Requesting albuterol and Singulair Taking flovent 44mg  1 puff BID Using albuterol once or twice a day, since pollen really started to blow Prior to that she would find herself wheezing once a week or so, specially if she brought up groceries to 3rd floor apt Takes claritin and singulair Ran out of flonase Current use of albuterol: wheezing, coughing, chest tightness, responds well to one puffs Last oral steroids was several years ago Has never been hospitalized Quit smoking 6 months ago, not vaping either  Fall Risk  12/25/2018 09/12/2017 08/15/2017 08/08/2017 07/21/2017  Falls in the past year? 0 No No No No  Number falls in past yr: 0 - - - -  Injury with Fall? 0 - - - -     Depression screen [facility] 2/9 12/25/2018 09/12/2017 08/15/2017  Decreased Interest 0 0 0  Down, Depressed, Hopeless 0 0 0  PHQ - 2 Score 0 0 0    Allergies  Allergen Reactions  . Wellbutrin [Bupropion]     Prior to Admission medications   Medication Sig Start Date End Date Taking? Authorizing Provider  albuterol (PROVENTIL HFA;VENTOLIN HFA) 108 (90 Base) MCG/ACT inhaler Inhale 2 puffs into the lungs every 6 (six) hours as needed for wheezing or shortness of breath. 07/21/17    Myles Lipps, MD  [facility] 25 MG suppository  10/09/18   [provider]  busPIRone (BUSPAR) 7.5 MG tablet  12/16/18   [provider]  fluticasone (FLOVENT HFA) 44 MCG/ACT inhaler Inhale 2 puffs 2 (two) times daily into the lungs. 08/08/17   Myles Lipps, MD  folic acid (FOLVITE) 1 MG tablet  12/18/18   [provider]  ibuprofen (ADVIL,MOTRIN) 800 MG tablet Take 1 tablet (800 mg total) by mouth every 6 (six) hours. Patient not taking: Reported on 09/12/2017 07/28/16   Marlinda Mike, CNM  montelukast (SINGULAIR) 10 MG tablet TAKE ONE TABLET BY MOUTH EVERY NIGHT AT BEDTIME  (MUST KEEP APPOINTMENT ON 12/25/2018 FOR REFILLS) 12/17/18   Myles Lipps, MD    Past Medical History:  Diagnosis Date  . Anxiety   . Depression   . IBS (irritable bowel syndrome)   . Postpartum care following vaginal delivery (11/1) 07/27/2016    Past Surgical History:  Procedure Laterality Date  . DILATION AND CURETTAGE OF UTERUS     x 3  . WISDOM TOOTH EXTRACTION      Social History   Tobacco Use  . Smoking status: Former Smoker    Packs/day: 0.50    Types: Cigarettes  . Smokeless tobacco: Never Used  Substance Use Topics  . Alcohol use: Yes    Alcohol/week: 0.0 standard drinks    Comment: less than 1 per day    Family History  Problem Relation Age of  Onset  . Breast cancer Paternal Grandmother   . Cervical cancer Cousin   . Prostate cancer Paternal Grandfather   . Colon polyps Paternal Grandfather   . Irritable bowel syndrome Father     ROS  Per hpi  Objective  Vitals as reported by the patient: none  There were no vitals filed for this visit.  ASSESSMENT and PLAN  1. Mild persistent asthma without complication Not well controlled. Increase flovent from 1puff BID to 2 puffs BID. Refilled meds as requested. RTC precautions reviewed.  - albuterol (PROVENTIL HFA;VENTOLIN HFA) 108 (90 Base) MCG/ACT inhaler; Inhale 2 puffs into the lungs every 6 (six)  hours as needed for wheezing or shortness of breath.  2. Seasonal allergic rhinitis, unspecified trigger  Other orders - montelukast (SINGULAIR) 10 MG tablet; Take 1 tablet (10 mg total) by mouth at bedtime. - fluticasone (FLOVENT HFA) 44 MCG/ACT inhaler; Inhale 2 puffs into the lungs 2 (two) times daily. - fluticasone (FLONASE) 50 MCG/ACT nasal spray; Place 1 spray into both nostrils 2 (two) times daily.  FOLLOW-UP:  2 weeks   The above assessment and management plan was discussed with the patient. The patient verbalized understanding of and has agreed to the management plan. Patient is aware to call the clinic if symptoms persist or worsen. Patient is aware when to return to the clinic for a follow-up visit. Patient educated on when it is appropriate to go to the emergency department.    I provided 9 minutes of non-face-to-face time during this encounter.  Myles Lipps, MD Primary Care at [facility] [address] [place], [place] [address] Ph.  [phone] Fax [phone]

## 2019-01-14 ENCOUNTER — Telehealth (INDEPENDENT_AMBULATORY_CARE_PROVIDER_SITE_OTHER): Payer: BC Managed Care – PPO | Admitting: Family Medicine

## 2019-01-14 ENCOUNTER — Other Ambulatory Visit: Payer: Self-pay

## 2019-01-14 DIAGNOSIS — J4530 Mild persistent asthma, uncomplicated: Secondary | ICD-10-CM | POA: Diagnosis not present

## 2019-01-14 MED ORDER — ALBUTEROL SULFATE HFA 108 (90 BASE) MCG/ACT IN AERS: 2.0000 | INHALATION_SPRAY | Freq: Four times a day (QID) | RESPIRATORY_TRACT | 5 refills | Status: DC | PRN

## 2019-01-14 MED ORDER — FLUTICASONE PROPIONATE HFA 44 MCG/ACT IN AERO: 2.0000 | INHALATION_SPRAY | Freq: Two times a day (BID) | RESPIRATORY_TRACT | 5 refills | Status: DC

## 2019-01-14 NOTE — Progress Notes (Signed)
Pt is following up on asthma, says she is feeling a lot better since she is using her inhalers as needed. No attacks since last visit. No refills are needed at this time. No travel, anxiety or depression.

## 2019-01-14 NOTE — Progress Notes (Signed)
Virtual Visit via telephone Note  I connected with patient on 01/14/19 at 428pm by telephone and verified that I am speaking with the correct person using two identifiers. Leslie Small is currently located at home and patient is currently with her during visit. The provider, Myles Lipps, MD is located in their office at time of visit.  I discussed the limitations, risks, security and privacy concerns of performing an evaluation and management service by telephone and the availability of in person appointments. I also discussed with the patient that there may be a patient responsible charge related to this service. The patient expressed understanding and agreed to proceed.  No chief complaint on file.   Telephone visit today for follow up on asthma  HPI Last OV 2 weeks ago Asthma doing really well since increase of flovent to 88 mcg BID, no coughing, SOB, wheezing Has needed albuterol twice since then Denies any side effects  ?  Fall Risk  01/14/2019 12/25/2018 09/12/2017 08/15/2017 08/08/2017  Falls in the past year? 0 0 No No No  Number falls in past yr: 0 0 - - -  Injury with Fall? 0 0 - - -     Depression screen [facility] 2/9 01/14/2019 12/25/2018 09/12/2017  Decreased Interest 0 0 0  Down, Depressed, Hopeless 0 0 0  PHQ - 2 Score 0 0 0    Allergies  Allergen Reactions  . Wellbutrin [Bupropion]     Prior to Admission medications   Medication Sig Start Date End Date Taking? Authorizing Provider  albuterol (PROVENTIL HFA;VENTOLIN HFA) 108 (90 Base) MCG/ACT inhaler Inhale 2 puffs into the lungs every 6 (six) hours as needed for wheezing or shortness of breath. 12/25/18   Myles Lipps, MD  [facility] 25 MG suppository  10/09/18   [provider]  busPIRone (BUSPAR) 7.5 MG tablet  12/16/18   [provider]  fluticasone (FLONASE) 50 MCG/ACT nasal spray Place 1 spray into both nostrils 2 (two) times daily. 12/25/18   Myles Lipps, MD  fluticasone (FLOVENT  HFA) 44 MCG/ACT inhaler Inhale 2 puffs into the lungs 2 (two) times daily. 12/25/18   Myles Lipps, MD  folic acid (FOLVITE) 1 MG tablet  12/18/18   [provider]  ibuprofen (ADVIL,MOTRIN) 800 MG tablet Take 1 tablet (800 mg total) by mouth every 6 (six) hours. Patient not taking: Reported on 09/12/2017 07/28/16   Marlinda Mike, CNM  montelukast (SINGULAIR) 10 MG tablet Take 1 tablet (10 mg total) by mouth at bedtime. 12/25/18   Myles Lipps, MD    Past Medical History:  Diagnosis Date  . Anxiety   . Depression   . IBS (irritable bowel syndrome)   . Postpartum care following vaginal delivery (11/1) 07/27/2016    Past Surgical History:  Procedure Laterality Date  . DILATION AND CURETTAGE OF UTERUS     x 3  . WISDOM TOOTH EXTRACTION      Social History   Tobacco Use  . Smoking status: Former Smoker    Packs/day: 0.50    Types: Cigarettes  . Smokeless tobacco: Never Used  Substance Use Topics  . Alcohol use: Yes    Alcohol/week: 0.0 standard drinks    Comment: less than 1 per day    Family History  Problem Relation Age of Onset  . Breast cancer Paternal Grandmother   . Cervical cancer Cousin   . Prostate cancer Paternal Grandfather   . Colon polyps Paternal Grandfather   .  Irritable bowel syndrome Father     ROS Per hpi  Objective  Vitals as reported by the patient: none  There were no vitals filed for this visit.  ASSESSMENT and PLAN  1. Mild persistent asthma without complication Controlled. Continue current regime.  - albuterol (VENTOLIN HFA) 108 (90 Base) MCG/ACT inhaler; Inhale 2 puffs into the lungs every 6 (six) hours as needed for wheezing or shortness of breath.  Other orders - fluticasone (FLOVENT HFA) 44 MCG/ACT inhaler; Inhale 2 puffs into the lungs 2 (two) times daily.  FOLLOW-UP: 6 months   The above assessment and management plan was discussed with the patient. The patient verbalized understanding of and has agreed to the  management plan. Patient is aware to call the clinic if symptoms persist or worsen. Patient is aware when to return to the clinic for a follow-up visit. Patient educated on when it is appropriate to go to the emergency department.    I provided 6 minutes of non-face-to-face time during this encounter.  Myles LippsIrma M Santiago, MD [facility] at [facility] [address] [place], [place] [address] Ph.  [phone] Fax [phone]

## 2019-02-03 ENCOUNTER — Encounter: Payer: Self-pay | Admitting: Family Medicine

## 2019-05-30 DIAGNOSIS — Z124 Encounter for screening for malignant neoplasm of cervix: Secondary | ICD-10-CM | POA: Diagnosis not present

## 2019-05-30 DIAGNOSIS — Z01419 Encounter for gynecological examination (general) (routine) without abnormal findings: Secondary | ICD-10-CM | POA: Diagnosis not present

## 2019-05-30 DIAGNOSIS — Z1151 Encounter for screening for human papillomavirus (HPV): Secondary | ICD-10-CM | POA: Diagnosis not present

## 2019-05-30 DIAGNOSIS — Z6830 Body mass index (BMI) 30.0-30.9, adult: Secondary | ICD-10-CM | POA: Diagnosis not present

## 2019-07-15 ENCOUNTER — Ambulatory Visit (INDEPENDENT_AMBULATORY_CARE_PROVIDER_SITE_OTHER): Payer: BC Managed Care – PPO | Admitting: Family Medicine

## 2019-07-15 ENCOUNTER — Other Ambulatory Visit: Payer: Self-pay

## 2019-07-15 ENCOUNTER — Encounter: Payer: Self-pay | Admitting: Family Medicine

## 2019-07-15 VITALS — BP 122/79 | HR 93 | Temp 99.1°F | Ht 62.0 in | Wt 187.2 lb

## 2019-07-15 DIAGNOSIS — H9191 Unspecified hearing loss, right ear: Secondary | ICD-10-CM | POA: Diagnosis not present

## 2019-07-15 DIAGNOSIS — H93A1 Pulsatile tinnitus, right ear: Secondary | ICD-10-CM | POA: Diagnosis not present

## 2019-07-15 DIAGNOSIS — J4530 Mild persistent asthma, uncomplicated: Secondary | ICD-10-CM | POA: Diagnosis not present

## 2019-07-15 DIAGNOSIS — J302 Other seasonal allergic rhinitis: Secondary | ICD-10-CM

## 2019-07-15 MED ORDER — MONTELUKAST SODIUM 10 MG PO TABS: 10.0000 mg | ORAL_TABLET | Freq: Every day | ORAL | 5 refills | Status: DC

## 2019-07-15 MED ORDER — FLOVENT HFA 44 MCG/ACT IN AERO: 2.0000 | INHALATION_SPRAY | Freq: Two times a day (BID) | RESPIRATORY_TRACT | 5 refills | Status: DC

## 2019-07-15 MED ORDER — ALBUTEROL SULFATE HFA 108 (90 BASE) MCG/ACT IN AERS: 2.0000 | INHALATION_SPRAY | Freq: Four times a day (QID) | RESPIRATORY_TRACT | 2 refills | Status: DC | PRN

## 2019-07-15 MED ORDER — FLUTICASONE PROPIONATE 50 MCG/ACT NA SUSP: 1.0000 | Freq: Two times a day (BID) | NASAL | 6 refills | Status: DC

## 2019-07-15 NOTE — Progress Notes (Signed)
10/19/202011:26 AM  Leslie Small [birth year], 33 y.o., female [id number]  Chief Complaint  Patient presents with  . chronic condition    6 months f.u     HPI:   Patient is a 33 y.o. female with past medical history significant for asthma who presents today for routine followup  Last OV April 2020 - telemedicine She is doing well Asthma well controlled on flovent and singulair Still does flovent Allergies are triggers: spring and fall Continues to use albuterol prn, less than weekly No nocturnal cough Has had her flu vaccine  Has had pulsing in right ear for past several months Having decreased hearing on the right Denies any nasal congestion   Depression screen [facility] 2/9 07/15/2019 01/14/2019 12/25/2018  Decreased Interest 0 0 0  Down, Depressed, Hopeless 0 0 0  PHQ - 2 Score 0 0 0    Fall Risk  07/15/2019 01/14/2019 12/25/2018 09/12/2017 08/15/2017  Falls in the past year? 0 0 0 No No  Number falls in past yr: 0 0 0 - -  Injury with Fall? 0 0 0 - -  Follow up Falls evaluation completed - - - -     Allergies  Allergen Reactions  . Wellbutrin [Bupropion]     Prior to Admission medications   Medication Sig Start Date End Date Taking? Authorizing Provider  albuterol (VENTOLIN HFA) 108 (90 Base) MCG/ACT inhaler Inhale 2 puffs into the lungs every 6 (six) hours as needed for wheezing or shortness of breath. 01/14/19  Yes Myles Lipps, MD  [facility] 25 MG suppository  10/09/18  Yes [provider]  fluticasone (FLONASE) 50 MCG/ACT nasal spray Place 1 spray into both nostrils 2 (two) times daily. 12/25/18  Yes Myles Lipps, MD  fluticasone (FLOVENT HFA) 44 MCG/ACT inhaler Inhale 2 puffs into the lungs 2 (two) times daily. 01/14/19  Yes Myles Lipps, MD  folic acid (FOLVITE) 1 MG tablet  12/18/18  Yes [provider]  montelukast (SINGULAIR) 10 MG tablet Take 1 tablet (10 mg total) by mouth at bedtime. 12/25/18  Yes Myles Lipps, MD  ibuprofen  (ADVIL,MOTRIN) 800 MG tablet Take 1 tablet (800 mg total) by mouth every 6 (six) hours. Patient not taking: Reported on 09/12/2017 07/28/16   Marlinda Mike, CNM    Past Medical History:  Diagnosis Date  . Anxiety   . Depression   . IBS (irritable bowel syndrome)   . Postpartum care following vaginal delivery (11/1) 07/27/2016    Past Surgical History:  Procedure Laterality Date  . DILATION AND CURETTAGE OF UTERUS     x 3  . WISDOM TOOTH EXTRACTION      Social History   Tobacco Use  . Smoking status: Former Smoker    Packs/day: 0.50    Types: Cigarettes  . Smokeless tobacco: Never Used  Substance Use Topics  . Alcohol use: Yes    Alcohol/week: 0.0 standard drinks    Comment: less than 1 per day    Family History  Problem Relation Age of Onset  . Breast cancer Paternal Grandmother   . Cervical cancer Cousin   . Prostate cancer Paternal Grandfather   . Colon polyps Paternal Grandfather   . Irritable bowel syndrome Father     Review of Systems  Constitutional: Negative for chills and fever.  HENT: Positive for hearing loss and tinnitus. Negative for congestion, ear discharge, ear pain and sinus pain.   Respiratory: Negative for cough and shortness of breath.  Cardiovascular: Negative for chest pain, palpitations and leg swelling.  Gastrointestinal: Negative for abdominal pain, nausea and vomiting.     OBJECTIVE:  Today's Vitals   07/15/19 1056  BP: 122/79  Pulse: 93  Temp: 99.1 F (37.3 C)  TempSrc: Oral  SpO2: 97%  Weight: 187 lb 3.2 oz (84.9 kg)  Height: 5\' 2"  (1.575 m)   Body mass index is 34.24 kg/m.   Physical Exam Vitals signs and nursing note reviewed.  Constitutional:      Appearance: She is well-developed.  HENT:     Head: Normocephalic and atraumatic.     Right Ear: Hearing, tympanic membrane, ear canal and external ear normal.     Left Ear: Hearing, tympanic membrane, ear canal and external ear normal.     Ears:     Comments: unable to  test rhine/weber with tunning fork of 256hz     Mouth/Throat:     Pharynx: No oropharyngeal exudate.  Eyes:     General: No scleral icterus.    Conjunctiva/sclera: Conjunctivae normal.     Pupils: Pupils are equal, round, and reactive to light.  Neck:     Musculoskeletal: Neck supple.     Vascular: No carotid bruit.  Cardiovascular:     Rate and Rhythm: Normal rate and regular rhythm.     Heart sounds: Normal heart sounds. No murmur. No friction rub. No gallop.   Pulmonary:     Effort: Pulmonary effort is normal.     Breath sounds: Normal breath sounds. No wheezing, rhonchi or rales.  Lymphadenopathy:     Cervical: No cervical adenopathy.  Skin:    General: Skin is warm and dry.  Neurological:     Mental Status: She is alert and oriented to person, place, and time.     No results found for this or any previous visit (from the past 24 hour(s)).  No results found.   ASSESSMENT and PLAN  1. Mild persistent asthma without complication Controlled. Continue current regime.  - albuterol (VENTOLIN HFA) 108 (90 Base) MCG/ACT inhaler; Inhale 2 puffs into the lungs every 6 (six) hours as needed for wheezing or shortness of breath.  2. Seasonal allergic rhinitis, unspecified trigger Controlled. Continue current regime.   3. Pulsatile tinnitus of right ear 4. Decreased hearing of right ear - Ambulatory referral to ENT  Other orders - fluticasone (FLONASE) 50 MCG/ACT nasal spray; Place 1 spray into both nostrils 2 (two) times daily. - fluticasone (FLOVENT HFA) 44 MCG/ACT inhaler; Inhale 2 puffs into the lungs 2 (two) times daily. - montelukast (SINGULAIR) 10 MG tablet; Take 1 tablet (10 mg total) by mouth at bedtime.  Return in about 6 months (around 01/13/2020).    Rutherford Guys, MD Primary Care at [place],  [address] Ph.  [phone] Fax [phone]

## 2019-07-15 NOTE — Patient Instructions (Signed)
° ° ° °  If you have lab work done today you will be contacted with your lab results within the next 2 weeks.  If you have not heard from us then please contact us. The fastest way to get your results is to register for My Chart. ° ° °IF you received an x-ray today, you will receive an invoice from [facility]. Please contact [facility] at [phone] with questions or concerns regarding your invoice.  ° °IF you received labwork today, you will receive an invoice from [facility]. Please contact [facility] at [phone] with questions or concerns regarding your invoice.  ° °Our billing staff will not be able to assist you with questions regarding bills from these companies. ° °You will be contacted with the lab results as soon as they are available. The fastest way to get your results is to activate your My Chart account. Instructions are located on the last page of this paperwork. If you have not heard from us regarding the results in 2 weeks, please contact this office. °  ° ° ° °

## 2019-08-06 DIAGNOSIS — H9312 Tinnitus, left ear: Secondary | ICD-10-CM | POA: Diagnosis not present

## 2019-08-06 DIAGNOSIS — H9042 Sensorineural hearing loss, unilateral, left ear, with unrestricted hearing on the contralateral side: Secondary | ICD-10-CM | POA: Diagnosis not present

## 2019-08-16 ENCOUNTER — Other Ambulatory Visit: Payer: Self-pay | Admitting: Otolaryngology

## 2019-08-16 DIAGNOSIS — H93A9 Pulsatile tinnitus, unspecified ear: Secondary | ICD-10-CM

## 2019-08-19 ENCOUNTER — Other Ambulatory Visit: Payer: Self-pay

## 2019-08-19 ENCOUNTER — Ambulatory Visit
Admission: RE | Admit: 2019-08-19 | Discharge: 2019-08-19 | Disposition: A | Discharge status: Home / Self Care | Payer: Self-pay | Source: Ambulatory Visit | Attending: Otolaryngology | Admitting: Otolaryngology

## 2019-08-19 DIAGNOSIS — H93A9 Pulsatile tinnitus, unspecified ear: Secondary | ICD-10-CM

## 2019-08-19 IMAGING — MR MR HEAD WO/W CM
11 of 12 series · 39 of 48 positions shown · IV contrast (multihance)
Comparison: Head CT [DATE]

CLINICAL DATA: Pulsatile tinnitus. Additional history provided:
Patient reports "pulsing" in right ear with decreased hearing in
right ear for several months.

EXAM:
MRI HEAD WITHOUT AND WITH CONTRAST
TECHNIQUE: Multiplanar, multiecho pulse sequences of the brain and surrounding
structures were obtained without and with intravenous contrast.
CONTRAST:  16mL MULTIHANCE GADOBENATE DIMEGLUMINE 529 MG/ML IV SOLN

[Series 2: T1 · sagittal · 5.0mm · 0.45mm/px · 3 of 24 slices shown (1 of 3)]
[im 1/24]
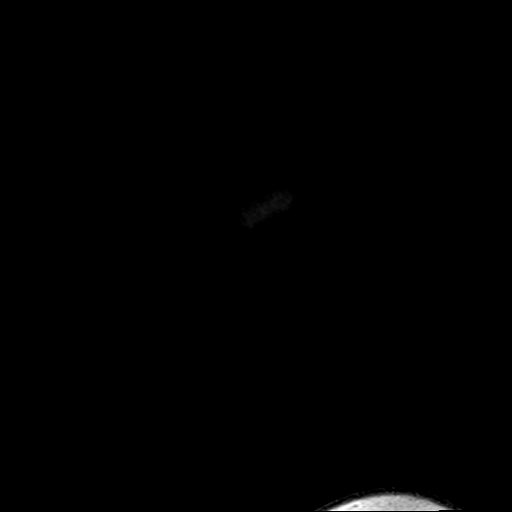
[im 12/24]
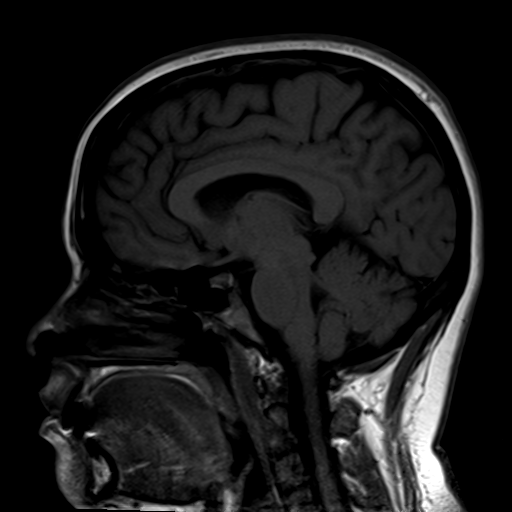
[im 24/24]
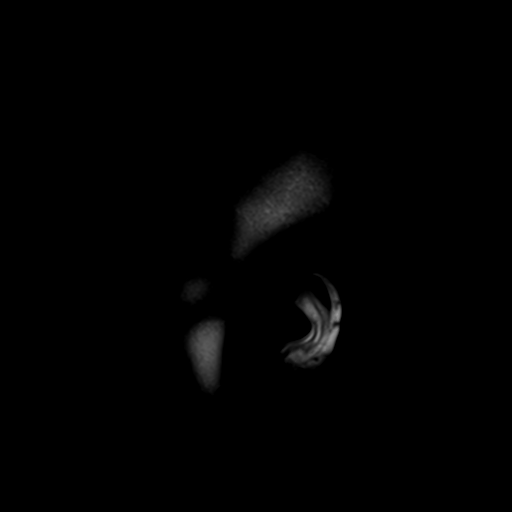

[Series 3: DWI · axial · 3.0mm · 1.80mm/px · z∈[-63,+93]mm · 11 of 106 slices shown (1 of 2)]
[im 1/106]
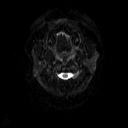
[im 11/106]
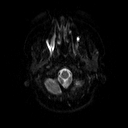
[im 22/106]
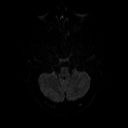
[im 32/106]
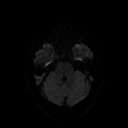
[im 43/106]
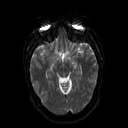
[im 53/106]
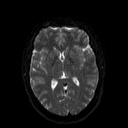
[im 64/106]
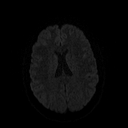
[im 74/106]
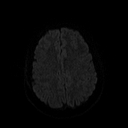
[im 85/106]
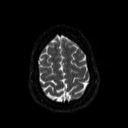
[im 95/106]
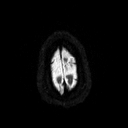
[im 106/106]
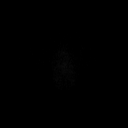

[Series 4: DWI · axial · 3.0mm · 1.80mm/px · z∈[-63,+93]mm · 5 of 52 slices shown (2 of 2)]
[im 1/52]
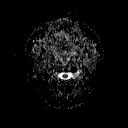
[im 13/52]
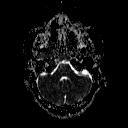
[im 26/52]
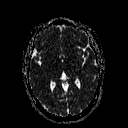
[im 39/52]
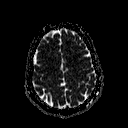
[im 52/52]
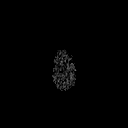

[Series 5: T2 · axial · 5.0mm · 0.60mm/px · z∈[-56,+93]mm · 3 of 24 slices shown]
[im 1/24]
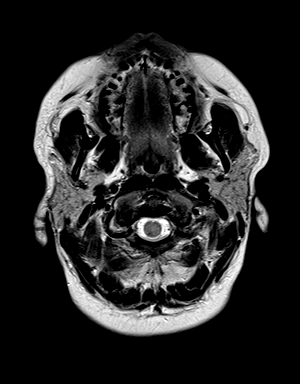
[im 12/24]
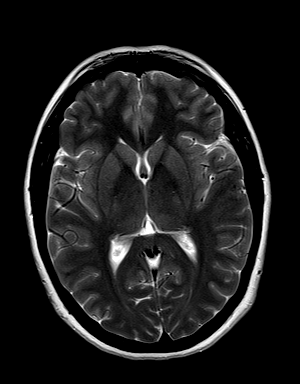
[im 24/24]
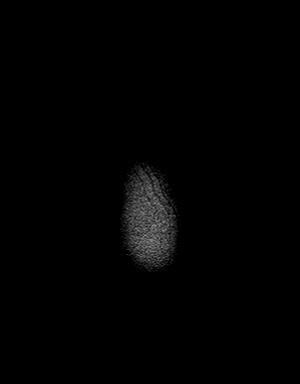

[Series 6: FLAIR · axial · 3.0mm · 0.45mm/px · z∈[-60,+95]mm · 3 of 27 slices shown]
[im 1/27]
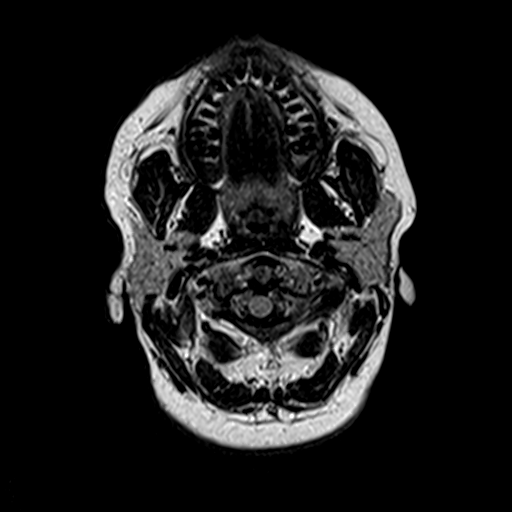
[im 14/27]
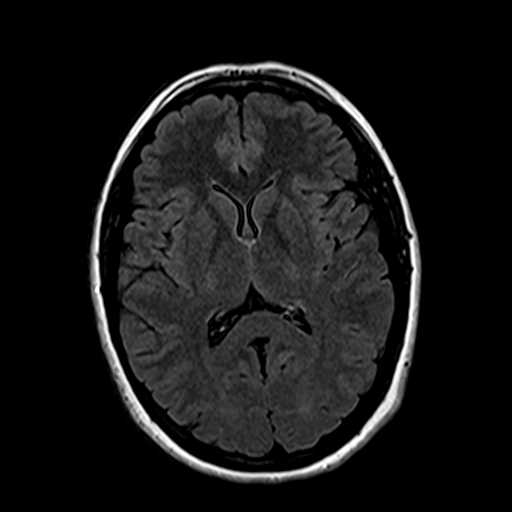
[im 27/27]
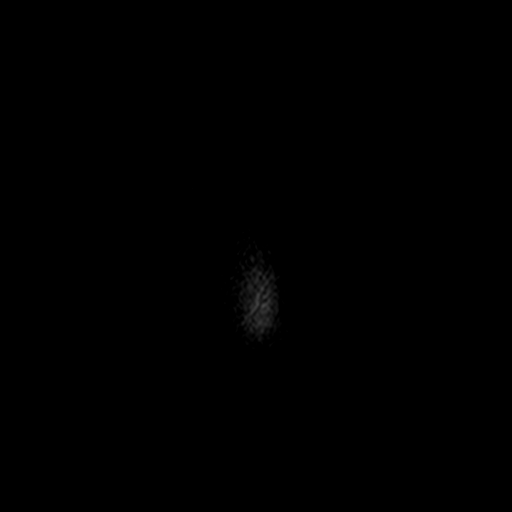

[Series 8: swi_images · axial · 3.0mm · 0.90mm/px · z∈[-58,+95]mm · 5 of 52 slices shown]
[im 1/52]
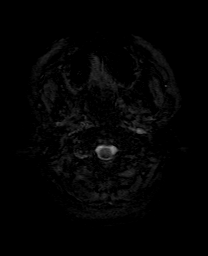
[im 13/52]
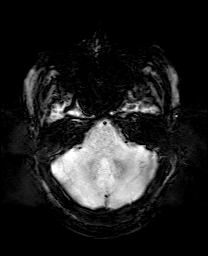
[im 26/52]
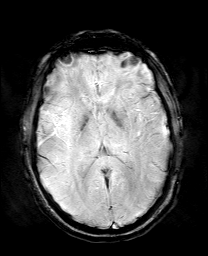
[im 39/52]
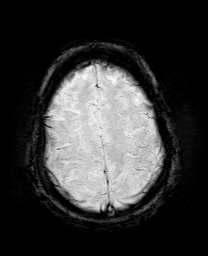
[im 52/52]
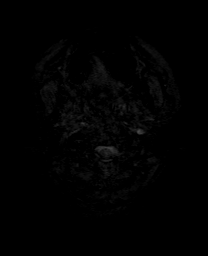

[Series 9: T1 · coronal · 3.0mm · 0.35mm/px · 1 of 14 slices shown (2 of 3)]
[im 1/14]
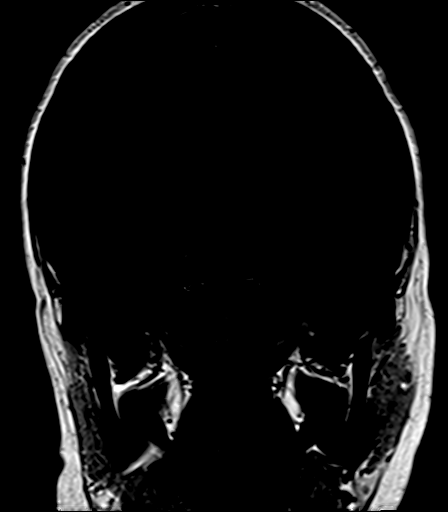

[Series 10: T1 · axial · 3.0mm · 0.35mm/px · z∈[-50,-2]mm · 2 of 15 slices shown (3 of 3)]
[im 1/15]
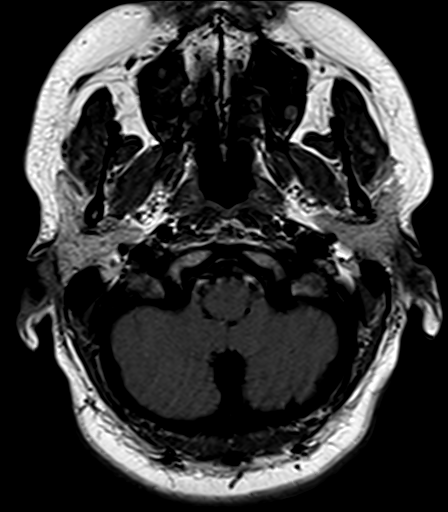
[im 15/15]
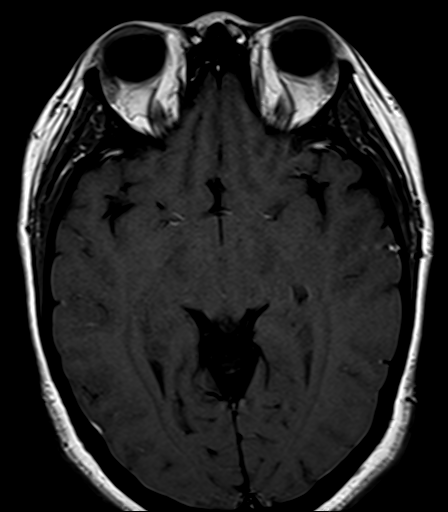

[Series 11: bSSFP · axial · 1.0mm · 0.28mm/px · z∈[-45,-20]mm · 3 of 40 slices shown]
[im 1/40]
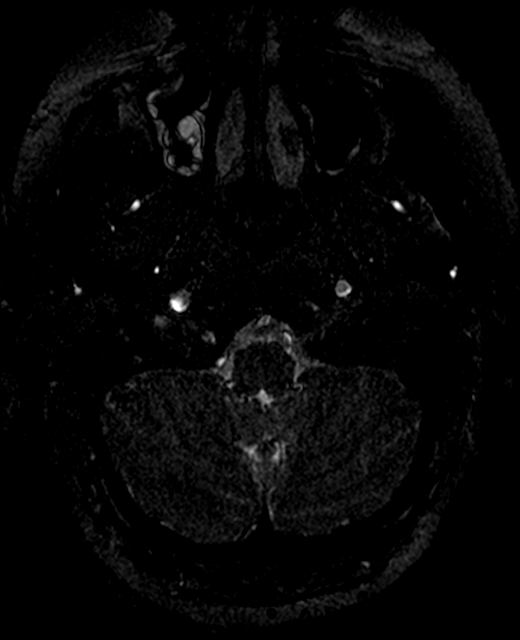
[im 14/40]
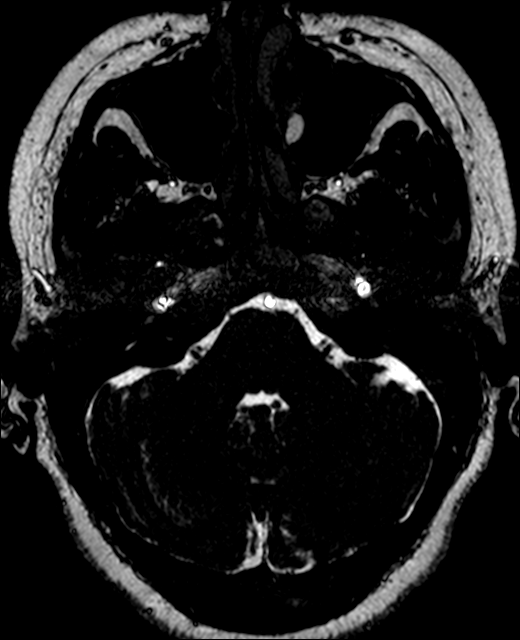
[im 27/40]
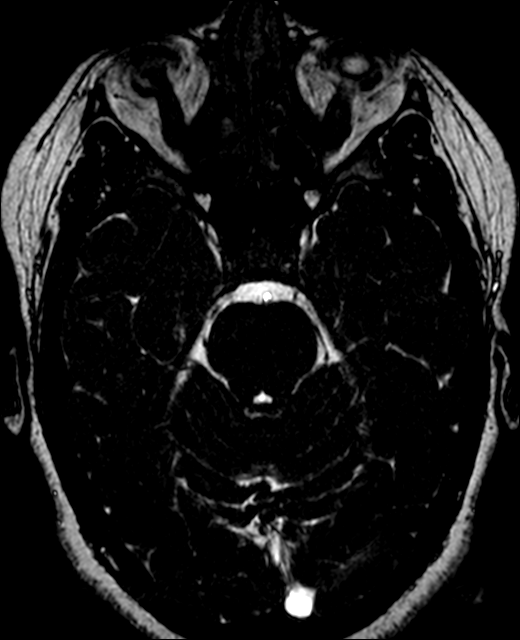

[Series 12: T1 post-contrast · coronal · 3.0mm · 0.35mm/px · 1 of 14 slices shown (1 of 2)]
[im 1/14]
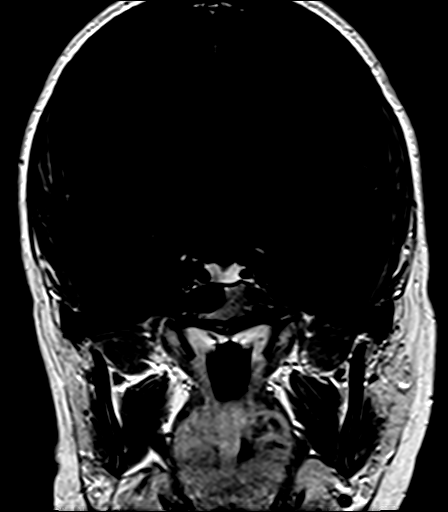

[Series 13: T1 post-contrast · axial · 3.0mm · 0.35mm/px · z∈[-50,-2]mm · 2 of 15 slices shown (2 of 2)]
[im 1/15]
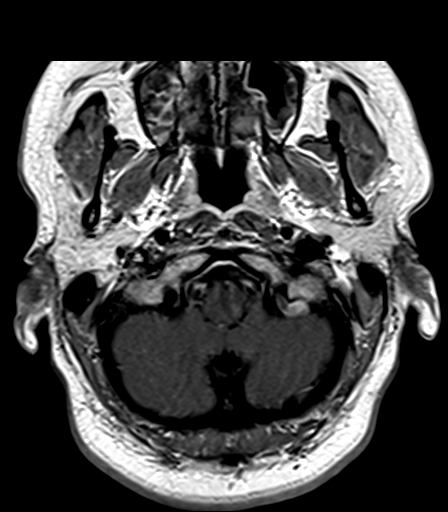
[im 15/15]
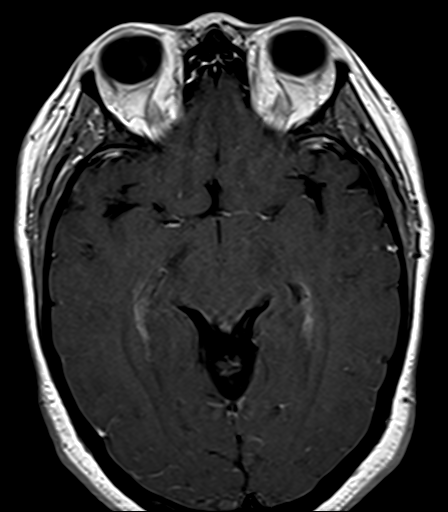

[39 of 48 positions shown; findings below may reference images not displayed]

FINDINGS: Brain:

There is no evidence of acute infarct.

No evidence of intracranial mass.

No midline shift or extra-axial fluid collection.

No chronic intracranial blood products.

No focal parenchymal signal abnormality or abnormal intracranial
enhancement is identified.

No cerebellopontine angle mass or internal auditory canal lesion is
demonstrated. Normal appearance of the seventh and eighth cranial
nerves bilaterally.

Cerebral volume is normal for age.

Vascular: Flow voids maintained within the proximal large arterial
vessels. Expected enhancement within the dural venous sinuses.

Skull and upper cervical spine: No focal marrow lesion

Sinuses/Orbits: Visualized orbits demonstrate no acute abnormality.
Mild ethmoid sinus mucosal thickening. Small bilateral maxillary
sinus mucous retention cysts. No significant mastoid effusion.
IMPRESSION: Normal MRI appearance of the brain for age. No evidence of acute
intracranial abnormality.

No cerebellopontine angle mass or internal auditory canal lesion is
demonstrated.

Mild paranasal sinus disease with small bilateral maxillary sinus
mucous retention cysts.

## 2019-08-19 MED ORDER — GADOBENATE DIMEGLUMINE 529 MG/ML IV SOLN
16.0000 mL | Freq: Once | INTRAVENOUS | Status: AC | PRN
  Administered 2019-08-19: 16 mL via INTRAVENOUS

## 2019-09-03 ENCOUNTER — Other Ambulatory Visit: Payer: Self-pay | Admitting: Otolaryngology

## 2019-09-03 DIAGNOSIS — H93A9 Pulsatile tinnitus, unspecified ear: Secondary | ICD-10-CM

## 2019-09-10 ENCOUNTER — Ambulatory Visit
Admission: RE | Admit: 2019-09-10 | Discharge: 2019-09-10 | Disposition: A | Discharge status: Home / Self Care | Payer: BC Managed Care – PPO | Source: Ambulatory Visit | Attending: Otolaryngology | Admitting: Otolaryngology

## 2019-09-10 DIAGNOSIS — H93A9 Pulsatile tinnitus, unspecified ear: Secondary | ICD-10-CM

## 2019-09-10 DIAGNOSIS — H93A2 Pulsatile tinnitus, left ear: Secondary | ICD-10-CM | POA: Diagnosis not present

## 2019-09-10 IMAGING — MR MR MRA HEAD W/O CM
1 series · 22 of 48 positions shown · non-contrast
Comparison: Prior brain MRI from [DATE].

CLINICAL DATA: Initial evaluation for asymmetric pulsatile
tinnitus, left side for 7 months.

EXAM:
MRA HEAD WITHOUT CONTRAST
TECHNIQUE: Angiographic images of the Circle of Willis were obtained using MRA
technique without intravenous contrast.

[Series 3: tof_3d_multi-slab · axial · 0.7mm · 0.35mm/px · z∈[-34,+30]mm · 22 of 99 slices shown]
[im 1/99]
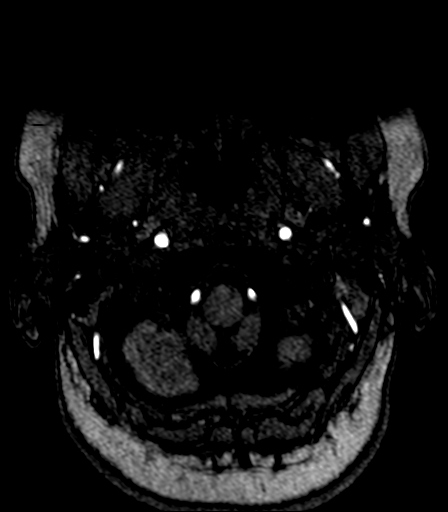
[im 3/99]
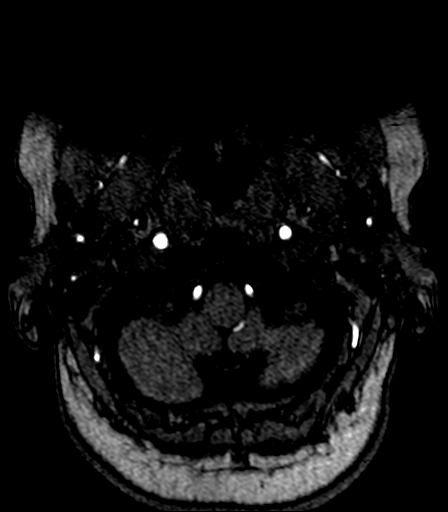
[im 5/99]
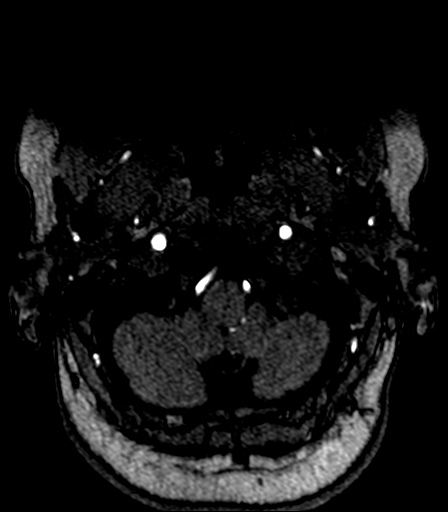
[im 7/99]
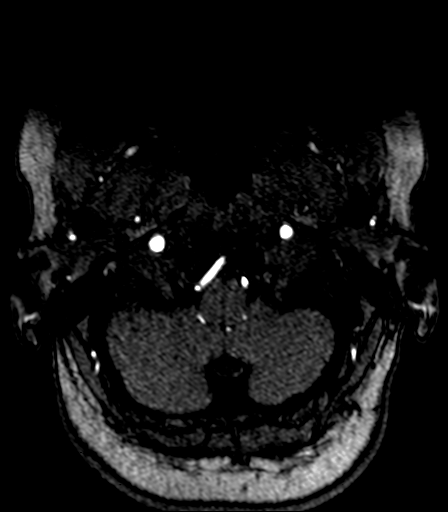
[im 9/99]
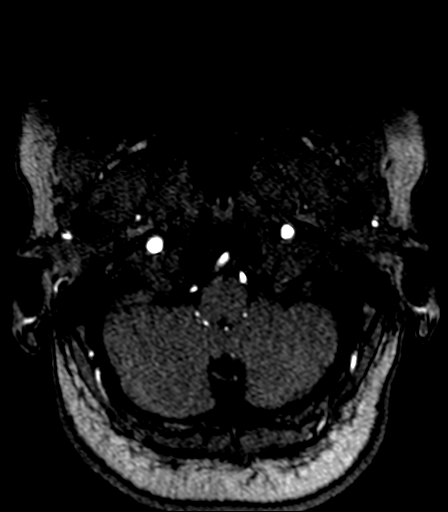
[im 11/99]
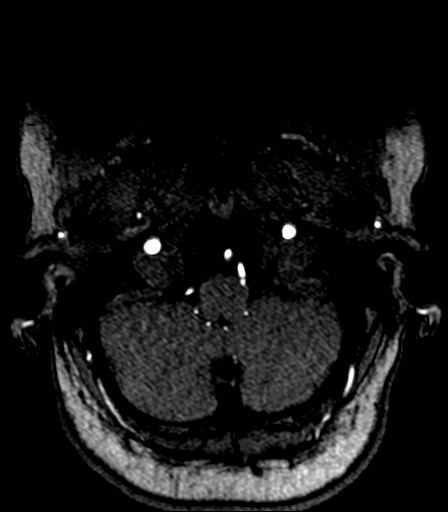
[im 13/99]
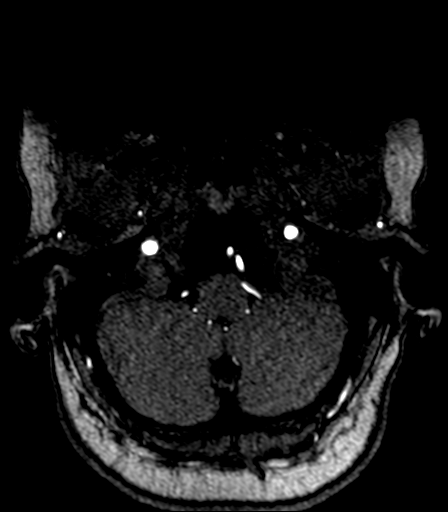
[im 15/99]
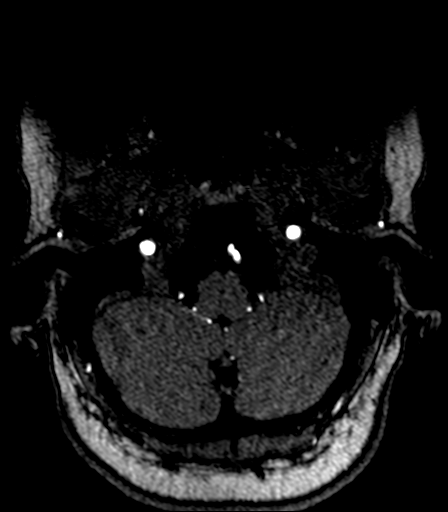
[im 17/99]
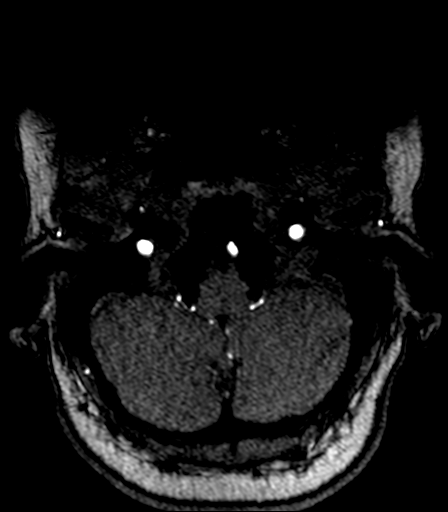
[im 19/99]
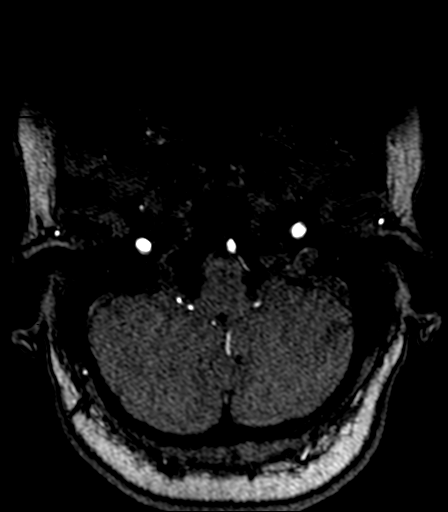
[im 21/99]
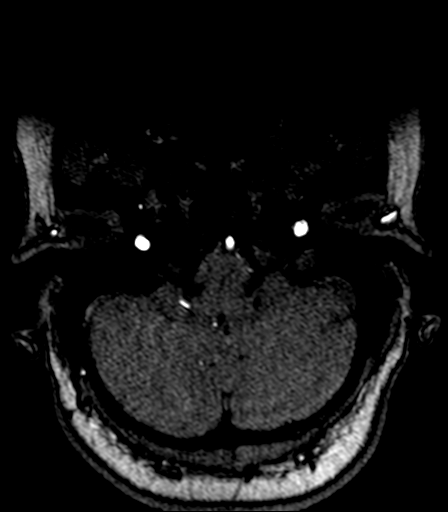
[im 23/99]
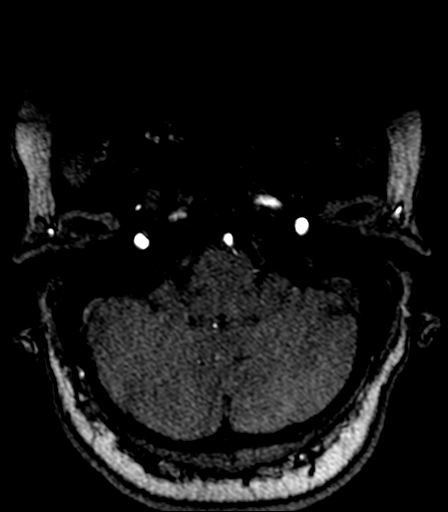
[im 26/99]
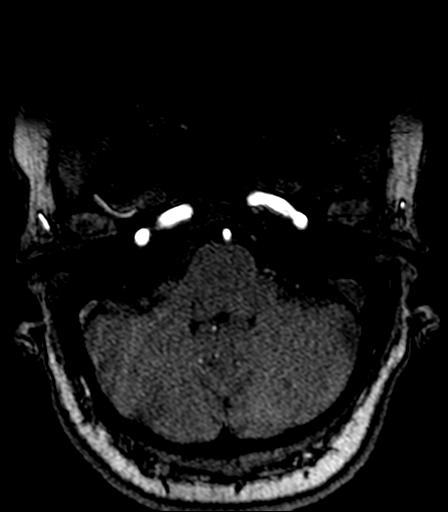
[im 28/99]
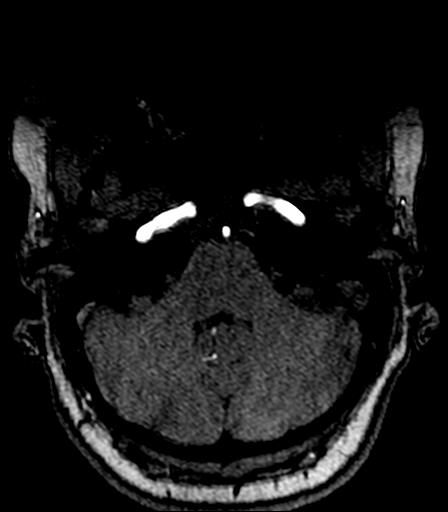
[im 32/99]
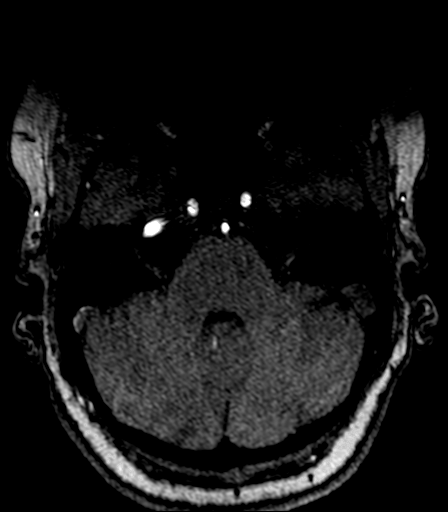
[im 44/99]
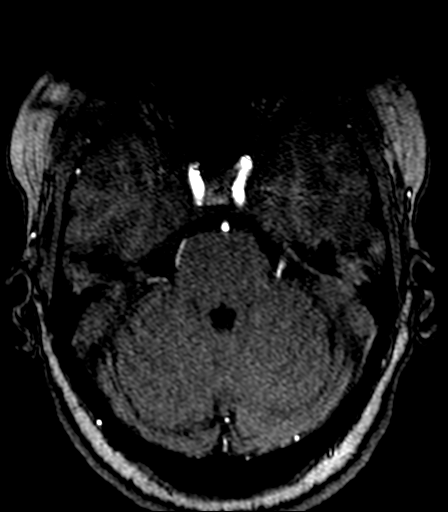
[im 51/99]
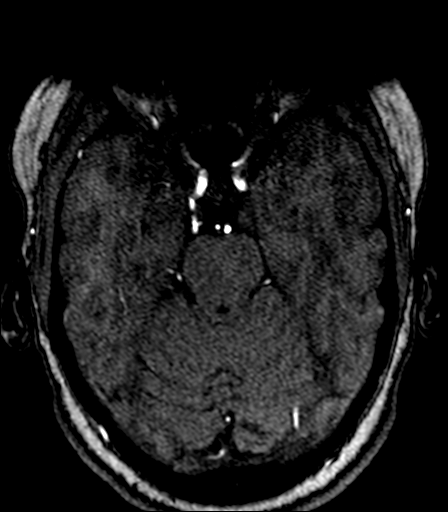
[im 57/99]
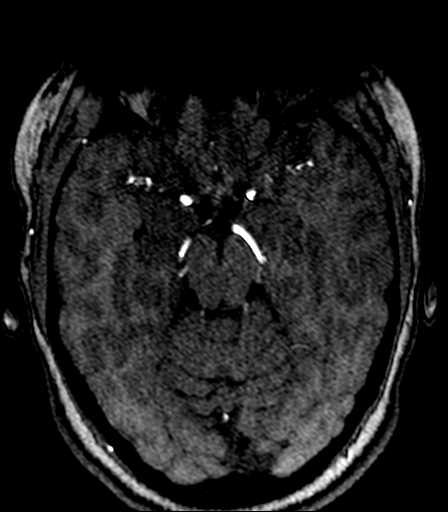
[im 69/99]
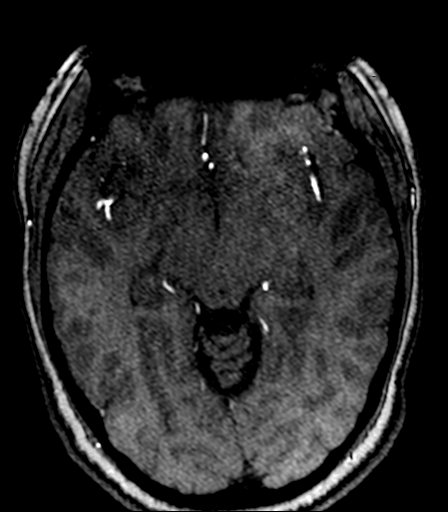
[im 82/99]
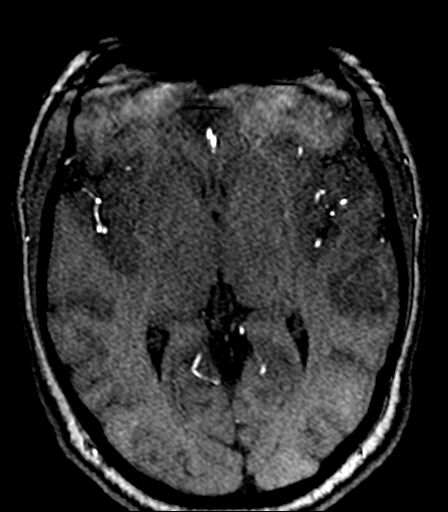
[im 84/99]
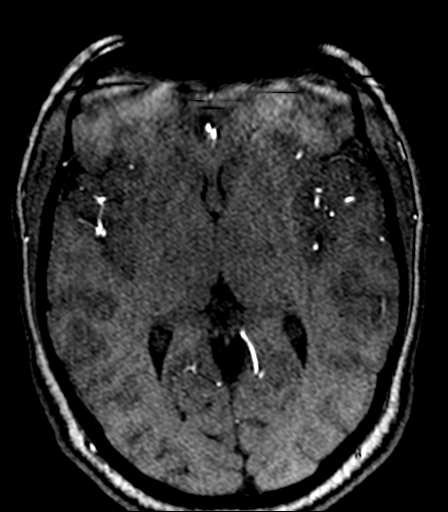
[im 94/99]
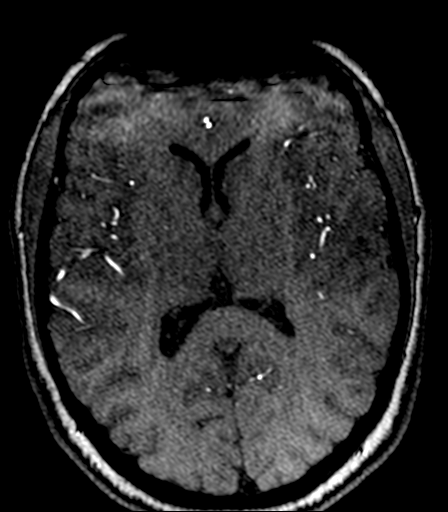

[22 of 48 positions shown; findings below may reference images not displayed]

FINDINGS: ANTERIOR CIRCULATION:

Examination mildly degraded by motion artifact.

Visualized distal cervical segments of the internal carotid arteries
are widely patent with symmetric antegrade flow. Petrous, cavernous,
and supraclinoid ICAs widely patent without stenosis or other
abnormality. Origin of the ophthalmic arteries patent. ICA termini
well perfused. A1 segments patent bilaterally. Normal anterior
communicating artery complex. Anterior cerebral arteries widely
patent to their distal aspects without stenosis. No M1 stenosis or
occlusion. Normal MCA bifurcations. Distal MCA branches well
perfused and symmetric.

POSTERIOR CIRCULATION:

Visualized vertebral arteries widely patent to the vertebrobasilar
junction without stenosis. Posterior inferior cerebral arteries
patent bilaterally. Basilar widely patent to its distal aspect
without abnormality. Visualized anterior inferior cerebral arteries
patent bilaterally. Both superior cerebral arteries well perfused.
Left PCA supplied primarily via the basilar. Right PCA supplied via
a hypoplastic right P1 segment and robust right posterior
communicating artery. Both PCAs widely patent to their distal
aspects.

No intracranial aneurysm or other vascular abnormality.
IMPRESSION: Normal intracranial MRA. No aneurysm or other vascular abnormality.
No findings to explain patient's symptoms identified.

## 2019-09-11 ENCOUNTER — Ambulatory Visit
Admission: RE | Admit: 2019-09-11 | Discharge: 2019-09-11 | Disposition: A | Discharge status: Home / Self Care | Payer: BC Managed Care – PPO | Source: Ambulatory Visit | Attending: Otolaryngology | Admitting: Otolaryngology

## 2019-09-11 ENCOUNTER — Other Ambulatory Visit: Payer: Self-pay

## 2019-09-11 DIAGNOSIS — H93A9 Pulsatile tinnitus, unspecified ear: Secondary | ICD-10-CM

## 2019-09-11 IMAGING — MR MR MRV HEAD W/O CM
1 series · 25 of 48 positions shown · non-contrast
Comparison: None.

CLINICAL DATA: Pulsatile tinnitus.

EXAM:
MR VENOGRAM OF THE HEAD WITHOUT CONTRAST
TECHNIQUE: Angiographic images of the intracranial venous structures were
obtained using MRV technique without intravenous contrast.

[Series 2: (id) coronal · coronal · 3.0mm · 0.49mm/px · 25 of 95 slices shown]
[im 1/95]
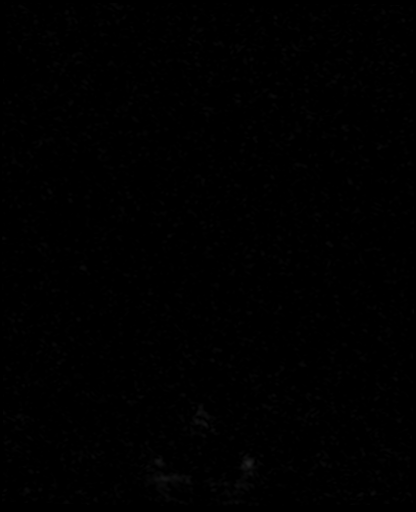
[im 3/95]
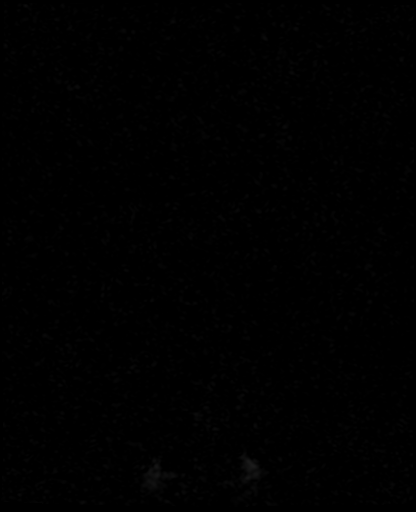
[im 5/95]
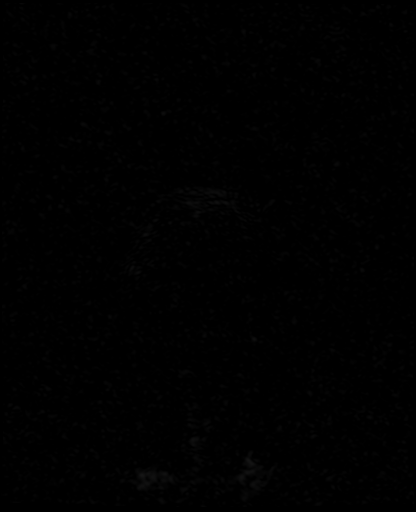
[im 7/95]
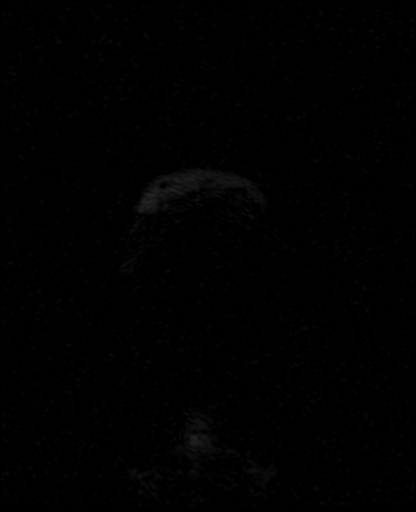
[im 9/95]
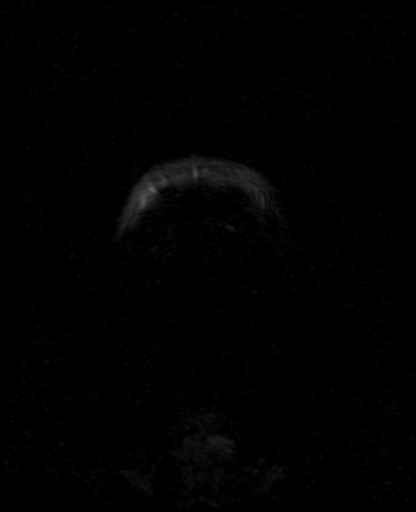
[im 11/95]
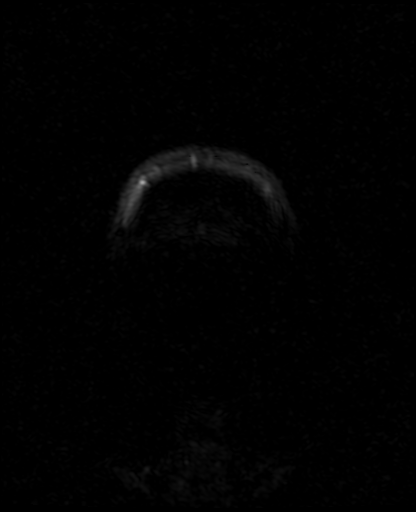
[im 13/95]
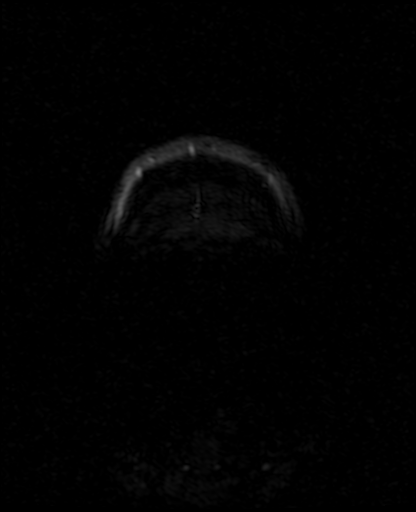
[im 15/95]
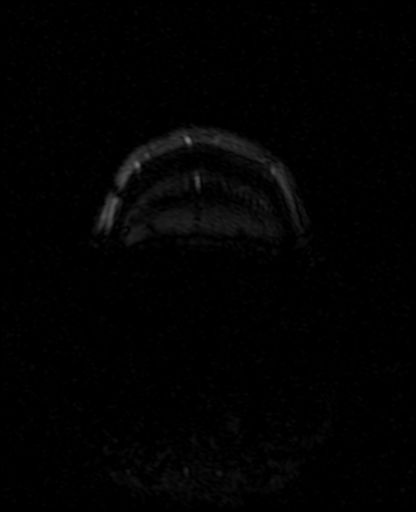
[im 17/95]
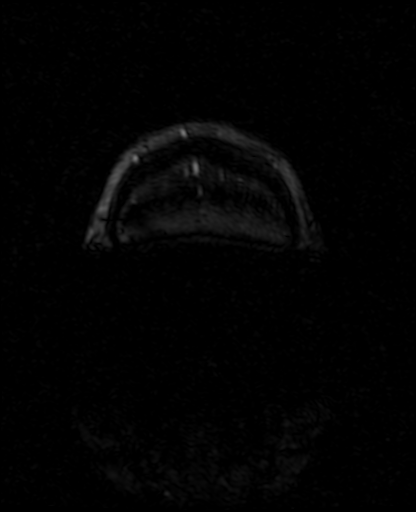
[im 19/95]
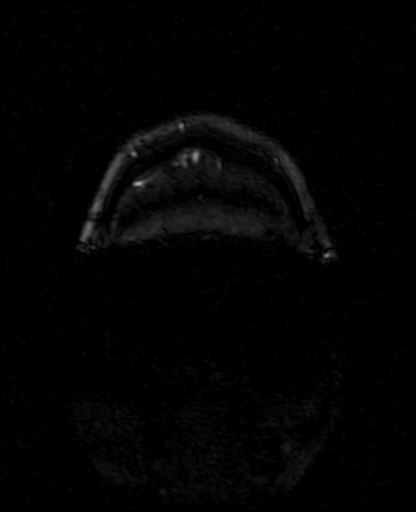
[im 21/95]
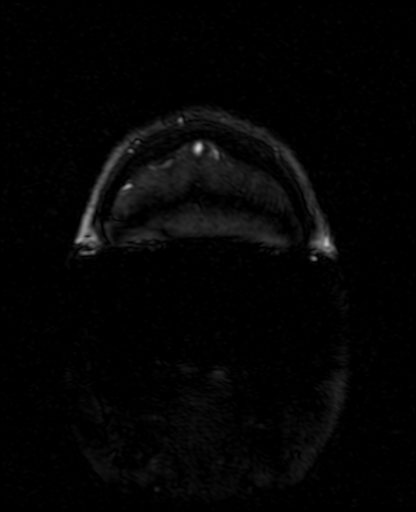
[im 23/95]
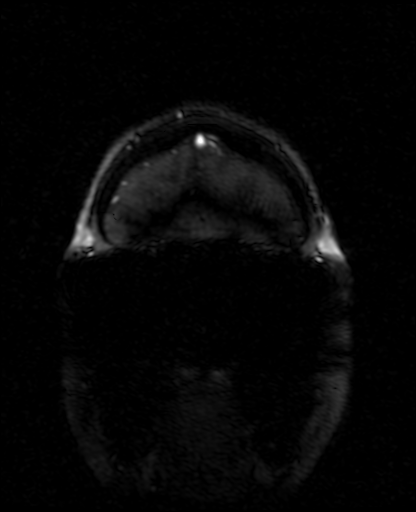
[im 25/95]
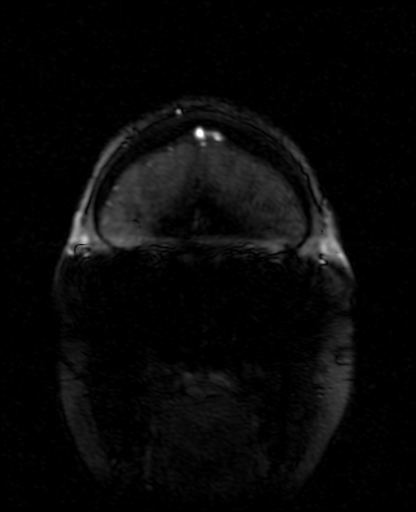
[im 27/95]
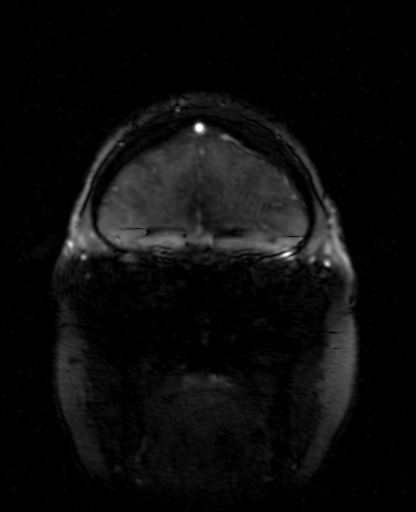
[im 29/95]
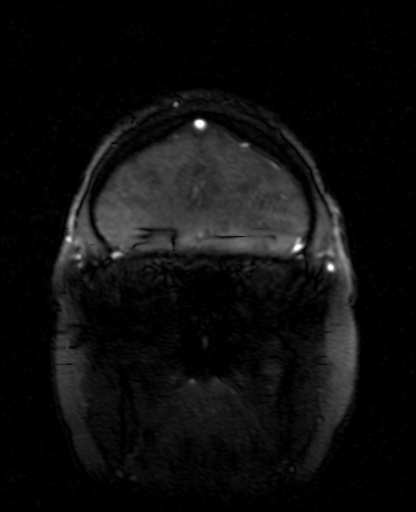
[im 31/95]
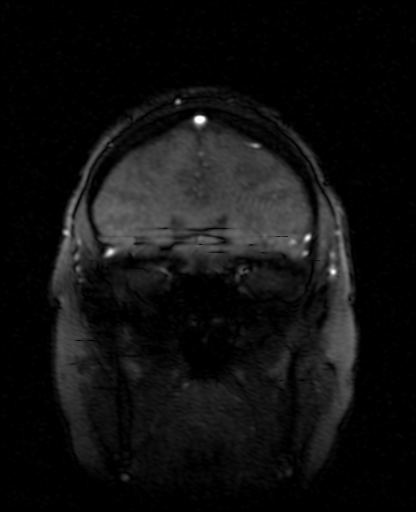
[im 33/95]
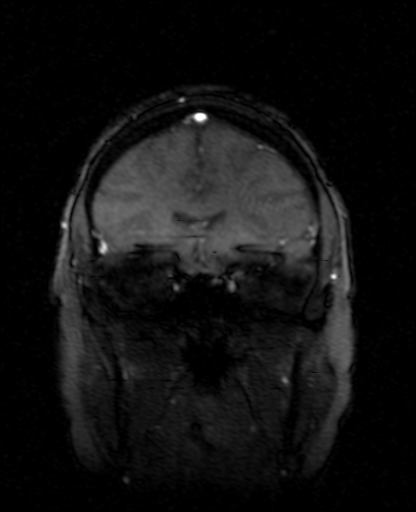
[im 35/95]
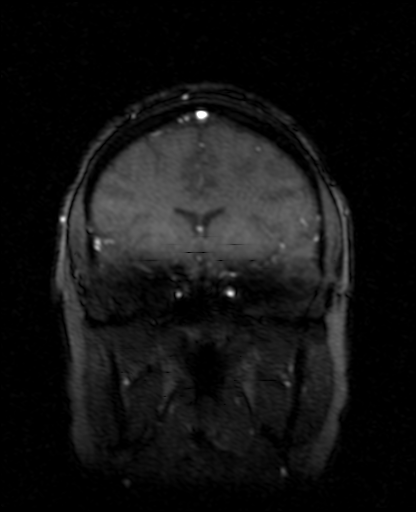
[im 43/95]
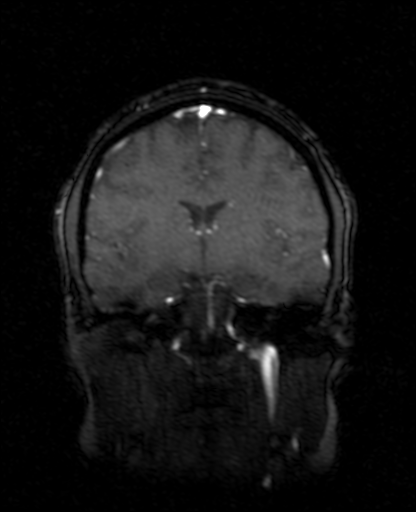
[im 49/95]
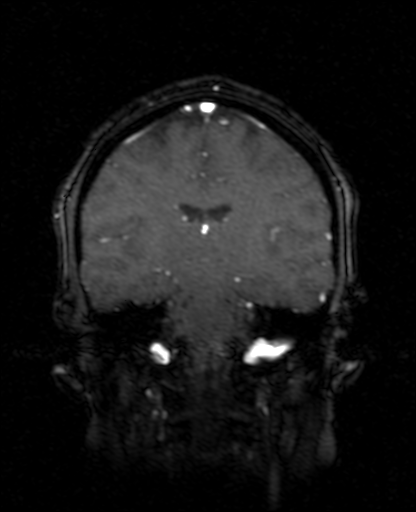
[im 55/95]
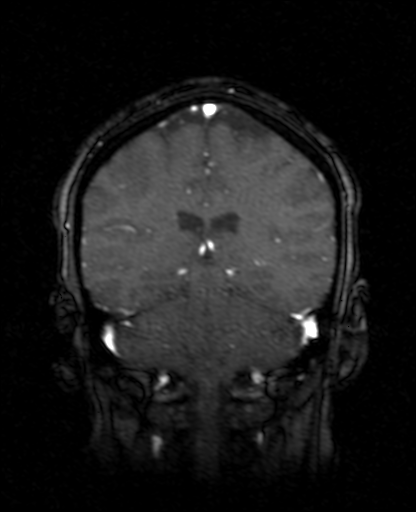
[im 67/95]
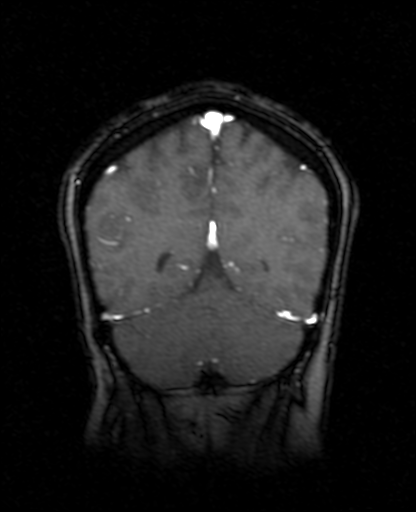
[im 79/95]
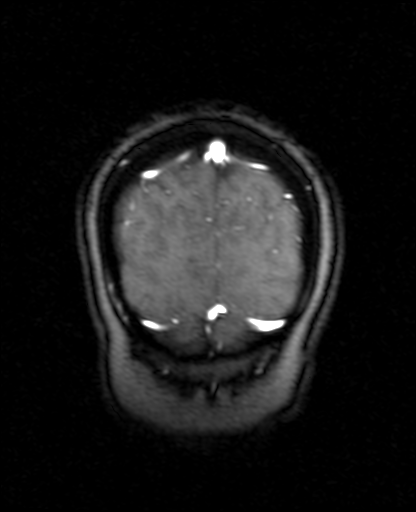
[im 81/95]
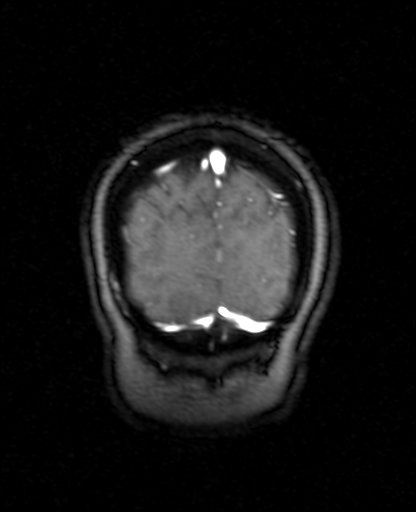
[im 91/95]
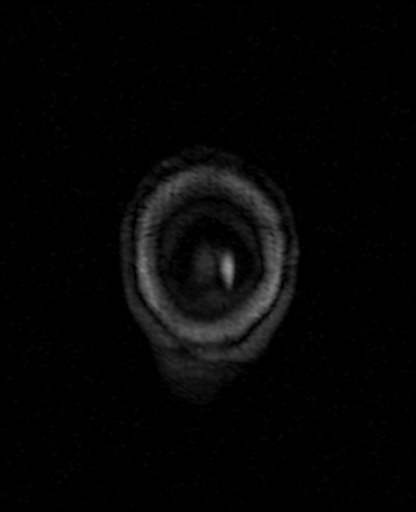

[25 of 48 positions shown; findings below may reference images not displayed]

FINDINGS: Superior sagittal sinus, internal cerebral veins, vein of DZREKE,
straight sinus, transverse sinuses, sigmoid sinuses, and jugular
bulbs are patent without evidence of thrombus or significant
stenosis. The left transverse and sigmoid sinuses are mildly
dominant.
IMPRESSION: Negative head MRV.

## 2019-10-07 DIAGNOSIS — H9312 Tinnitus, left ear: Secondary | ICD-10-CM | POA: Diagnosis not present

## 2019-10-07 DIAGNOSIS — H9042 Sensorineural hearing loss, unilateral, left ear, with unrestricted hearing on the contralateral side: Secondary | ICD-10-CM | POA: Diagnosis not present

## 2019-10-07 DIAGNOSIS — H6982 Other specified disorders of Eustachian tube, left ear: Secondary | ICD-10-CM | POA: Diagnosis not present

## 2019-10-14 DIAGNOSIS — H6982 Other specified disorders of Eustachian tube, left ear: Secondary | ICD-10-CM | POA: Diagnosis not present

## 2019-10-14 DIAGNOSIS — H6522 Chronic serous otitis media, left ear: Secondary | ICD-10-CM | POA: Diagnosis not present

## 2019-11-01 ENCOUNTER — Other Ambulatory Visit: Payer: Self-pay | Admitting: Family Medicine

## 2019-11-01 DIAGNOSIS — J4530 Mild persistent asthma, uncomplicated: Secondary | ICD-10-CM

## 2019-11-18 DIAGNOSIS — H7202 Central perforation of tympanic membrane, left ear: Secondary | ICD-10-CM | POA: Diagnosis not present

## 2019-11-18 DIAGNOSIS — H9042 Sensorineural hearing loss, unilateral, left ear, with unrestricted hearing on the contralateral side: Secondary | ICD-10-CM | POA: Diagnosis not present

## 2019-12-26 ENCOUNTER — Other Ambulatory Visit: Payer: Self-pay | Admitting: Family Medicine

## 2019-12-26 DIAGNOSIS — J4530 Mild persistent asthma, uncomplicated: Secondary | ICD-10-CM

## 2019-12-27 ENCOUNTER — Encounter: Payer: Self-pay | Admitting: Family Medicine

## 2019-12-31 ENCOUNTER — Other Ambulatory Visit: Payer: Self-pay | Admitting: Family Medicine

## 2019-12-31 NOTE — Telephone Encounter (Signed)
Requested Prescriptions  Pending Prescriptions Disp Refills  . montelukast (SINGULAIR) 10 MG tablet [Pharmacy Med Name: MONTELUKAST 10MG  TABLETS] 90 tablet 1    Sig: TAKE 1 TABLET BY MOUTH EVERY NIGHT AT BEDTIME     Pulmonology:  Leukotriene Inhibitors Passed - 12/31/2019  3:21 AM      Passed - Valid encounter within last 12 months    Recent Outpatient Visits          5 months ago Mild persistent asthma without complication   Primary Care at 03/01/2020, Oneita Jolly, MD   11 months ago Mild persistent asthma without complication   [facility] at [facility], Oneita Jolly, MD   1 year ago Mild persistent asthma without complication   [facility] at [facility], Oneita Jolly, MD   2 years ago Skin lesion   [facility] Care at [facility], Oneita Jolly, MD   2 years ago Mild persistent asthma, unspecified whether complicated   Primary Care at [facility], Oneita Jolly, MD      Future Appointments            In 1 week [facility], MD Primary Care at [place], [facility]

## 2020-01-13 ENCOUNTER — Ambulatory Visit (INDEPENDENT_AMBULATORY_CARE_PROVIDER_SITE_OTHER): Payer: BC Managed Care – PPO | Admitting: Family Medicine

## 2020-01-13 ENCOUNTER — Encounter: Payer: Self-pay | Admitting: Family Medicine

## 2020-01-13 ENCOUNTER — Other Ambulatory Visit: Payer: Self-pay

## 2020-01-13 VITALS — BP 112/75 | HR 105 | Temp 98.0°F | Ht 62.0 in | Wt 200.0 lb

## 2020-01-13 DIAGNOSIS — J4540 Moderate persistent asthma, uncomplicated: Secondary | ICD-10-CM | POA: Diagnosis not present

## 2020-01-13 DIAGNOSIS — F411 Generalized anxiety disorder: Secondary | ICD-10-CM

## 2020-01-13 DIAGNOSIS — J301 Allergic rhinitis due to pollen: Secondary | ICD-10-CM | POA: Diagnosis not present

## 2020-01-13 DIAGNOSIS — Z6832 Body mass index (BMI) 32.0-32.9, adult: Secondary | ICD-10-CM | POA: Diagnosis not present

## 2020-01-13 MED ORDER — FLUTICASONE PROPIONATE 50 MCG/ACT NA SUSP: 1.0000 | Freq: Two times a day (BID) | NASAL | 6 refills | Status: AC

## 2020-01-13 MED ORDER — AZELASTINE HCL 137 MCG/SPRAY NA SOLN: 2.0000 | Freq: Two times a day (BID) | NASAL | 5 refills | Status: AC

## 2020-01-13 MED ORDER — ALBUTEROL SULFATE HFA 108 (90 BASE) MCG/ACT IN AERS: INHALATION_SPRAY | RESPIRATORY_TRACT | 5 refills | Status: AC

## 2020-01-13 MED ORDER — ESCITALOPRAM OXALATE 10 MG PO TABS: 10.0000 mg | ORAL_TABLET | Freq: Every day | ORAL | 2 refills | Status: DC

## 2020-01-13 MED ORDER — FLOVENT HFA 110 MCG/ACT IN AERO: 1.0000 | INHALATION_SPRAY | Freq: Two times a day (BID) | RESPIRATORY_TRACT | 12 refills | Status: AC

## 2020-01-13 NOTE — Progress Notes (Signed)
4/19/20218:54 AM  Leslie Small August 03, 1986, 34 y.o., female [id number]  Chief Complaint  Patient presents with  . Asthma    medication refills  . Weight Gain    and talk about anxiety    HPI:   Patient is a 34 y.o. female with past medical history significant for asthma who presents today for routine followup  Last OV oct 2020 - no changes She has new concerns for weight gain and anxiety  She reports that her asthma for past 3 weeks has been worse as allergies are getting worse She takes singulair, flonase max dose and claritin Continues to have PND and sneezing Inhaler flovent 54mcg BID Has been using albuterol for about 3 x week, responds well  ENT placed T tube in left ear She does not smoke  Anxiety sign worse Recently bought new house, her husband is injured Has taken meds for anxiety in high school  She does get time for herself on a daily basis She has been stress eating, sleeping about 4 hours a night Does treadmill an hour a day Gad 7 noted   Wt Readings from Last 3 Encounters:  01/13/20 200 lb (90.7 kg)  07/15/19 187 lb 3.2 oz (84.9 kg)  09/12/17 178 lb 3.2 oz (80.8 kg)   GAD 7 : Generalized Anxiety Score 01/13/2020  Nervous, Anxious, on Edge 2  Control/stop worrying 2  Worry too much - different things 3  Trouble relaxing 3  Restless 2  Easily annoyed or irritable 3  Afraid - awful might happen 1  Total GAD 7 Score 16  Anxiety Difficulty Very difficult     Depression screen [facility] 2/9 01/13/2020 07/15/2019 01/14/2019  Decreased Interest 0 0 0  Down, Depressed, Hopeless 0 0 0  PHQ - 2 Score 0 0 0    Fall Risk  01/13/2020 07/15/2019 01/14/2019 12/25/2018 09/12/2017  Falls in the past year? 0 0 0 0 No  Number falls in past yr: 0 0 0 0 -  Injury with Fall? 0 0 0 0 -  Follow up Falls evaluation completed Falls evaluation completed - - -     Allergies  Allergen Reactions  . Wellbutrin [Bupropion]     Prior to Admission medications   Medication  Sig Start Date End Date Taking? Authorizing Provider  albuterol (VENTOLIN HFA) 108 (90 Base) MCG/ACT inhaler INHALE 2 PUFFS BY MOUTH EVERY 6 HOURS AS NEEDED FOR SHORTNESS OF BREATH 12/26/19  Yes Rutherford Guys, MD  fluticasone [facility]) 50 MCG/ACT nasal spray Place 1 spray into both nostrils 2 (two) times daily. 07/15/19  Yes Rutherford Guys, MD  fluticasone (FLOVENT HFA) 44 MCG/ACT inhaler Inhale 2 puffs into the lungs 2 (two) times daily. 07/15/19  Yes Rutherford Guys, MD  folic acid (FOLVITE) 1 MG tablet  12/18/18  Yes [provider]  montelukast (SINGULAIR) 10 MG tablet TAKE 1 TABLET BY MOUTH EVERY NIGHT AT BEDTIME 12/31/19  Yes Rutherford Guys, MD  [facility] 25 MG suppository  10/09/18   [provider]    Past Medical History:  Diagnosis Date  . Anxiety   . Depression   . IBS (irritable bowel syndrome)   . Postpartum care following vaginal delivery (11/1) 07/27/2016    Past Surgical History:  Procedure Laterality Date  . DILATION AND CURETTAGE OF UTERUS     x 3  . WISDOM TOOTH EXTRACTION      Social History   Tobacco Use  . Smoking status: Former Smoker  Packs/day: 0.50    Types: Cigarettes  . Smokeless tobacco: Never Used  Substance Use Topics  . Alcohol use: Yes    Alcohol/week: 0.0 standard drinks    Comment: less than 1 per day    Family History  Problem Relation Age of Onset  . Breast cancer Paternal Grandmother   . Cervical cancer Cousin   . Prostate cancer Paternal Grandfather   . Colon polyps Paternal Grandfather   . Irritable bowel syndrome Father     Review of Systems  Constitutional: Negative for chills and fever.  Respiratory: Positive for shortness of breath and wheezing. Negative for cough.   Cardiovascular: Negative for chest pain, palpitations and leg swelling.  Gastrointestinal: Negative for abdominal pain, nausea and vomiting.  Psychiatric/Behavioral: The patient is nervous/anxious and has insomnia.    Per  hpi  OBJECTIVE:  Today's Vitals   01/13/20 0842  BP: 112/75  Pulse: (!) 105  Temp: 98 F (36.7 C)  SpO2: 96%  Weight: 200 lb (90.7 kg)  Height: 5\' 2"  (1.575 m)   Body mass index is 36.58 kg/m.   Physical Exam Vitals and nursing note reviewed.  Constitutional:      Appearance: She is well-developed.  HENT:     Head: Normocephalic and atraumatic.     Right Ear: Hearing, tympanic membrane, ear canal and external ear normal.     Left Ear: Hearing, tympanic membrane, ear canal and external ear normal. A PE tube is present.  Eyes:     Conjunctiva/sclera: Conjunctivae normal.     Pupils: Pupils are equal, round, and reactive to light.  Cardiovascular:     Rate and Rhythm: Normal rate and regular rhythm.     Heart sounds: Normal heart sounds. No murmur. No friction rub. No gallop.   Pulmonary:     Effort: Pulmonary effort is normal.     Breath sounds: Normal breath sounds. No wheezing or rales.  Musculoskeletal:     Cervical back: Neck supple.  Lymphadenopathy:     Cervical: No cervical adenopathy.  Skin:    General: Skin is warm and dry.  Neurological:     Mental Status: She is alert and oriented to person, place, and time.     No results found for this or any previous visit (from the past 24 hour(s)).  No results found.   ASSESSMENT and PLAN  1. Moderate persistent asthma without complication 2. Seasonal allergic rhinitis due to pollen Not well controlled. Increase flovent, add azelastine. Consider referral to allergy and asthma. RTC precautions given.  - albuterol (VENTOLIN HFA) 108 (90 Base) MCG/ACT inhaler; INHALE 2 PUFFS BY MOUTH EVERY 6 HOURS AS NEEDED FOR SHORTNESS OF BREATH  3. Generalized anxiety disorder New diagnosis. Start lexapro, reviewed r/se/b. Consider vistaril/ trazodone for sleep if needed. Patient to call back. Consider counseling. - TSH  3. BMI 32.0-32.9,adult Stress eating major neg contributing factor as patient already exercising  regularly. Addressing anxiety. Cont to work on late night snacking.  Other orders - fluticasone (FLONASE) 50 MCG/ACT nasal spray; Place 1 spray into both nostrils 2 (two) times daily. - fluticasone (FLOVENT HFA) 110 MCG/ACT inhaler; Inhale 1 puff into the lungs 2 (two) times daily. - Azelastine HCl 137 MCG/SPRAY SOLN; Place 2 sprays into the nose 2 (two) times daily. - escitalopram (LEXAPRO) 10 MG tablet; Take 1 tablet (10 mg total) by mouth at bedtime.  Return in about 4 weeks (around 02/10/2020).    02/12/2020, MD [facility] [address] [place], [place] [address] Ph.  [phone] Fax [phone]   

## 2020-01-13 NOTE — Patient Instructions (Signed)
° ° ° °  If you have lab work done today you will be contacted with your lab results within the next 2 weeks.  If you have not heard from us then please contact us. The fastest way to get your results is to register for My Chart. ° ° °IF you received an x-ray today, you will receive an invoice from [facility]. Please contact [facility] at [phone] with questions or concerns regarding your invoice.  ° °IF you received labwork today, you will receive an invoice from [facility]. Please contact [facility] at [phone] with questions or concerns regarding your invoice.  ° °Our billing staff will not be able to assist you with questions regarding bills from these companies. ° °You will be contacted with the lab results as soon as they are available. The fastest way to get your results is to activate your My Chart account. Instructions are located on the last page of this paperwork. If you have not heard from us regarding the results in 2 weeks, please contact this office. °  ° ° ° °

## 2020-01-14 LAB — TSH: TSH: 1.67 u[IU]/mL (ref 0.450–4.500)

## 2020-01-20 ENCOUNTER — Encounter: Payer: Self-pay | Admitting: Family Medicine

## 2020-01-20 MED ORDER — FLUOXETINE HCL 20 MG PO TABS: 20.0000 mg | ORAL_TABLET | Freq: Every day | ORAL | 3 refills | Status: DC

## 2020-01-20 NOTE — Telephone Encounter (Signed)
Pt would like to trial Prozac as she took it in high school and it worked but unfortunately is struggling to hear her alarm in the morning or her kids at night and that concerns her with the Lexapro

## 2020-02-10 ENCOUNTER — Other Ambulatory Visit: Payer: Self-pay

## 2020-02-10 ENCOUNTER — Ambulatory Visit (INDEPENDENT_AMBULATORY_CARE_PROVIDER_SITE_OTHER): Payer: BC Managed Care – PPO | Admitting: Family Medicine

## 2020-02-10 ENCOUNTER — Encounter: Payer: Self-pay | Admitting: Family Medicine

## 2020-02-10 VITALS — BP 112/75 | HR 92 | Temp 98.3°F | Ht 62.0 in | Wt 197.4 lb

## 2020-02-10 DIAGNOSIS — J301 Allergic rhinitis due to pollen: Secondary | ICD-10-CM

## 2020-02-10 DIAGNOSIS — J4540 Moderate persistent asthma, uncomplicated: Secondary | ICD-10-CM | POA: Diagnosis not present

## 2020-02-10 DIAGNOSIS — F411 Generalized anxiety disorder: Secondary | ICD-10-CM | POA: Diagnosis not present

## 2020-02-10 MED ORDER — FLUOXETINE HCL 20 MG PO TABS: 20.0000 mg | ORAL_TABLET | Freq: Every day | ORAL | 1 refills | Status: DC

## 2020-02-10 NOTE — Progress Notes (Signed)
5/17/202110:03 AM  Leslie Small Oct 02, 1985, 34 y.o., female [id number]  Chief Complaint  Patient presents with  . Depression    feels like the prozac is doing well  . Allergies    HPI:   Patient is a 34 y.o. 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    GAD 7 : Generalized Anxiety Score 01/13/2020  Nervous, Anxious, on Edge 2  Control/stop worrying 2  Worry too much - different things 3  Trouble relaxing 3  Restless 2  Easily annoyed or irritable 3  Afraid - awful might happen 1  Total GAD 7 Score 16  Anxiety Difficulty Very difficult     Depression screen [facility] 2/9 02/10/2020 01/13/2020 07/15/2019  Decreased Interest 0 0 0  Down, Depressed, Hopeless 0 0 0  PHQ - 2 Score 0 0 0  Altered sleeping 1 - -  Tired, decreased energy 1 - -  Change in appetite 0 - -  Feeling bad or failure about yourself  0 - -  Trouble concentrating 1 - -  Moving slowly or fidgety/restless 0 - -  Suicidal thoughts 0 - -  PHQ-9 Score 3 - -  Difficult doing work/chores Not difficult at all - -    Fall Risk  02/10/2020 01/13/2020 07/15/2019 01/14/2019 12/25/2018  Falls in the past year? 0 0 0 0 0  Number falls in past yr: 0 0 0 0 0  Injury with Fall? 0 0 0 0 0  Follow up - Falls evaluation completed Falls evaluation completed - -     Allergies  Allergen Reactions  . Wellbutrin [Bupropion]     Prior to Admission medications   Medication Sig Start Date End  Date Taking? Authorizing Provider  albuterol (VENTOLIN HFA) 108 (90 Base) MCG/ACT inhaler INHALE 2 PUFFS BY MOUTH EVERY 6 HOURS AS NEEDED FOR SHORTNESS OF BREATH 01/13/20  Yes Myles Lipps, MD  [facility] 25 MG suppository  10/09/18  Yes [provider]  Azelastine HCl 137 MCG/SPRAY SOLN Place 2 sprays into the nose 2 (two) times daily. 01/13/20  Yes Myles Lipps, MD  FLUoxetine (PROZAC) 20 MG tablet Take 1 tablet (20 mg total) by mouth daily. 01/20/20  Yes Myles Lipps, MD  fluticasone (FLONASE) 50 MCG/ACT nasal spray Place 1 spray into both nostrils 2 (two) times daily. 01/13/20  Yes Myles Lipps, MD  fluticasone (FLOVENT HFA) 110 MCG/ACT inhaler Inhale 1 puff into the lungs 2 (two) times daily. 01/13/20  Yes Myles Lipps, MD  folic acid (FOLVITE) 1 MG tablet  12/18/18  Yes [provider]  montelukast (SINGULAIR) 10 MG tablet TAKE 1 TABLET BY MOUTH EVERY NIGHT AT BEDTIME 12/31/19  Yes Myles Lipps, MD    Past Medical History:  Diagnosis Date  . Anxiety   . Depression   . IBS (irritable bowel syndrome)   . Postpartum care following vaginal delivery (11/1) 07/27/2016    Past Surgical History:  Procedure Laterality Date  . DILATION  AND CURETTAGE OF UTERUS     x 3  . WISDOM TOOTH EXTRACTION      Social History   Tobacco Use  . Smoking status: Former Smoker    Packs/day: 0.50    Types: Cigarettes  . Smokeless tobacco: Never Used  Substance Use Topics  . Alcohol use: Yes    Alcohol/week: 0.0 standard drinks    Comment: less than 1 per day    Family History  Problem Relation Age of Onset  . Breast cancer Paternal Grandmother   . Cervical cancer Cousin   . Prostate cancer Paternal Grandfather   . Colon polyps Paternal Grandfather   . Irritable bowel syndrome Father     Review of Systems  Constitutional: Negative for chills and fever.  Respiratory: Negative for cough and shortness of breath.   Cardiovascular: Negative for chest pain,  palpitations and leg swelling.  Gastrointestinal: Negative for abdominal pain, nausea and vomiting.     OBJECTIVE:  Today's Vitals   02/10/20 0931  BP: 112/75  Pulse: 92  Temp: 98.3 F (36.8 C)  SpO2: 97%  Weight: 197 lb 6.4 oz (89.5 kg)  Height: 5\' 2"  (1.575 m)   Body mass index is 36.1 kg/m.   Physical Exam Vitals and nursing note reviewed.  Constitutional:      Appearance: She is well-developed.  HENT:     Head: Normocephalic and atraumatic.     Mouth/Throat:     Pharynx: No oropharyngeal exudate.  Eyes:     General: No scleral icterus.    Conjunctiva/sclera: Conjunctivae normal.     Pupils: Pupils are equal, round, and reactive to light.  Cardiovascular:     Rate and Rhythm: Normal rate and regular rhythm.     Heart sounds: Normal heart sounds. No murmur. No friction rub. No gallop.   Pulmonary:     Effort: Pulmonary effort is normal.     Breath sounds: Normal breath sounds. No wheezing or rales.  Musculoskeletal:     Cervical back: Neck supple.  Skin:    General: Skin is warm and dry.  Neurological:     Mental Status: She is alert and oriented to person, place, and time.     No results found for this or any previous visit (from the past 24 hour(s)).  No results found.   ASSESSMENT and PLAN  1. Moderate persistent asthma without complication Controlled. Continue current regime. Consider decreasing flovent to 49mcg BID once spring passes  2. Seasonal allergic rhinitis due to pollen Improved. Continue current regime. Declines referral at this time  3. Generalized anxiety disorder Controlled. Continue current regime.   Other orders - FLUoxetine (PROZAC) 20 MG tablet; Take 1 tablet (20 mg total) by mouth daily.  Return in about 6 months (around 08/12/2020).    Rutherford Guys, MD Primary Care at [place], [place] [address] Ph.  [phone] Fax [phone]

## 2020-02-10 NOTE — Patient Instructions (Signed)
° ° ° °  If you have lab work done today you will be contacted with your lab results within the next 2 weeks.  If you have not heard from us then please contact us. The fastest way to get your results is to register for My Chart. ° ° °IF you received an x-ray today, you will receive an invoice from [facility]. Please contact [facility] at [phone] with questions or concerns regarding your invoice.  ° °IF you received labwork today, you will receive an invoice from [facility]. Please contact [facility] at [phone] with questions or concerns regarding your invoice.  ° °Our billing staff will not be able to assist you with questions regarding bills from these companies. ° °You will be contacted with the lab results as soon as they are available. The fastest way to get your results is to activate your My Chart account. Instructions are located on the last page of this paperwork. If you have not heard from us regarding the results in 2 weeks, please contact this office. °  ° ° ° °

## 2020-04-04 ENCOUNTER — Other Ambulatory Visit: Payer: Self-pay | Admitting: Family Medicine

## 2020-04-04 ENCOUNTER — Encounter: Payer: Self-pay | Admitting: Family Medicine

## 2020-04-06 MED ORDER — MONTELUKAST SODIUM 10 MG PO TABS: 10.0000 mg | ORAL_TABLET | Freq: Every day | ORAL | 1 refills | Status: DC

## 2020-08-13 ENCOUNTER — Other Ambulatory Visit: Payer: Self-pay

## 2020-08-13 ENCOUNTER — Encounter: Payer: Self-pay | Admitting: Family Medicine

## 2020-08-13 ENCOUNTER — Ambulatory Visit: Payer: BC Managed Care – PPO | Admitting: Family Medicine

## 2020-08-13 ENCOUNTER — Ambulatory Visit (INDEPENDENT_AMBULATORY_CARE_PROVIDER_SITE_OTHER): Payer: BC Managed Care – PPO | Admitting: Family Medicine

## 2020-08-13 VITALS — BP 125/83 | HR 95 | Temp 98.5°F | Ht 62.0 in | Wt 198.0 lb

## 2020-08-13 DIAGNOSIS — J301 Allergic rhinitis due to pollen: Secondary | ICD-10-CM

## 2020-08-13 DIAGNOSIS — F411 Generalized anxiety disorder: Secondary | ICD-10-CM | POA: Diagnosis not present

## 2020-08-13 DIAGNOSIS — J4540 Moderate persistent asthma, uncomplicated: Secondary | ICD-10-CM

## 2020-08-13 MED ORDER — MONTELUKAST SODIUM 10 MG PO TABS: 10.0000 mg | ORAL_TABLET | Freq: Every day | ORAL | 3 refills | Status: AC

## 2020-08-13 MED ORDER — FLUOXETINE HCL 20 MG PO TABS: 20.0000 mg | ORAL_TABLET | Freq: Every day | ORAL | 3 refills | Status: AC

## 2020-08-13 NOTE — Patient Instructions (Addendum)
Asthma, Adult  Asthma is a long-term (chronic) condition in which the airways get tight and narrow. The airways are the breathing passages that lead from the nose and mouth down into the lungs. A person with asthma will have times when symptoms get worse. These are called asthma attacks. They can cause coughing, whistling sounds when you breathe (wheezing), shortness of breath, and chest pain. They can make it hard to breathe. There is no cure for asthma, but medicines and lifestyle changes can help control it. There are many things that can bring on an asthma attack or make asthma symptoms worse (triggers). Common triggers include:  Mold.  Dust.  Cigarette smoke.  Cockroaches.  Things that can cause allergy symptoms (allergens). These include animal skin flakes (dander) and pollen from trees or grass.  Things that pollute the air. These may include household cleaners, wood smoke, smog, or chemical odors.  Cold air, weather changes, and wind.  Crying or laughing hard.  Stress.  Certain medicines or drugs.  Certain foods such as dried fruit, potato chips, and grape juice.  Infections, such as a cold or the flu.  Certain medical conditions or diseases.  Exercise or tiring activities. Asthma may be treated with medicines and by staying away from the things that cause asthma attacks. Types of medicines may include:  Controller medicines. These help prevent asthma symptoms. They are usually taken every day.  Fast-acting reliever or rescue medicines. These quickly relieve asthma symptoms. They are used as needed and provide short-term relief.  Allergy medicines if your attacks are brought on by allergens.  Medicines to help control the body's defense (immune) system. Follow these instructions at home: Avoiding triggers in your home  Change your heating and air conditioning filter often.  Limit your use of fireplaces and wood stoves.  Get rid of pests (such as roaches and  mice) and their droppings.  Throw away plants if you see mold on them.  Clean your floors. Dust regularly. Use cleaning products that do not smell.  Have someone vacuum when you are not home. Use a vacuum cleaner with a HEPA filter if possible.  Replace carpet with wood, tile, or vinyl flooring. Carpet can trap animal skin flakes and dust.  Use allergy-proof pillows, mattress covers, and box spring covers.  Wash bed sheets and blankets every week in hot water. Dry them in a dryer.  Keep your bedroom free of any triggers.  Avoid pets and keep windows closed when things that cause allergy symptoms are in the air.  Use blankets that are made of polyester or cotton.  Clean bathrooms and kitchens with bleach. If possible, have someone repaint the walls in these rooms with mold-resistant paint. Keep out of the rooms that are being cleaned and painted.  Wash your hands often with soap and water. If soap and water are not available, use hand sanitizer.  Do not allow anyone to smoke in your home. General instructions  Take over-the-counter and prescription medicines only as told by your doctor. ? Talk with your doctor if you have questions about how or when to take your medicines. ? Make note if you need to use your medicines more often than usual.  Do not use any products that contain nicotine or tobacco, such as cigarettes and e-cigarettes. If you need help quitting, ask your doctor.  Stay away from secondhand smoke.  Avoid doing things outdoors when allergen counts are high and when air quality is low.  Wear a ski  mask when doing outdoor activities in the winter. The mask should cover your nose and mouth. Exercise indoors on cold days if you can.  Warm up before you exercise. Take time to cool down after exercise.  Use a peak flow meter as told by your doctor. A peak flow meter is a tool that measures how well the lungs are working.  Keep track of the peak flow meter's readings.  Write them down.  Follow your asthma action plan. This is a written plan for taking care of your asthma and treating your attacks.  Make sure you get all the shots (vaccines) that your doctor recommends. Ask your doctor about a flu shot and a pneumonia shot.  Keep all follow-up visits as told by your doctor. This is important. Contact a doctor if:  You have wheezing, shortness of breath, or a cough even while taking medicine to prevent attacks.  The mucus you cough up (sputum) is thicker than usual.  The mucus you cough up changes from clear or [place] to yellow, green, gray, or bloody.  You have problems from the medicine you are taking, such as: ? A rash. ? Itching. ? Swelling. ? Trouble breathing.  You need reliever medicines more than 2-3 times a week.  Your peak flow reading is still at 50-79% of your personal best after following the action plan for 1 hour.  You have a fever. Get help right away if:  You seem to be worse and are not responding to medicine during an asthma attack.  You are short of breath even at rest.  You get short of breath when doing very little activity.  You have trouble eating, drinking, or talking.  You have chest pain or tightness.  You have a fast heartbeat.  Your lips or fingernails start to turn blue.  You are light-headed or dizzy, or you faint.  Your peak flow is less than 50% of your personal best.  You feel too tired to breathe normally. Summary  Asthma is a long-term (chronic) condition in which the airways get tight and narrow. An asthma attack can make it hard to breathe.  Asthma cannot be cured, but medicines and lifestyle changes can help control it.  Make sure you understand how to avoid triggers and how and when to use your medicines. This information is not intended to replace advice given to you by your health care provider. Make sure you discuss any questions you have with your health care provider. Document Revised:  11/15/2018 Document Reviewed: 10/17/2016 Elsevier Patient Education  [employer].   If you have lab work done today you will be contacted with your lab results within the next 2 weeks.  If you have not heard from [place] then please contact us. The fastest way to get your results is to register for My Chart.   IF you received an x-ray today, you will receive an invoice from [facility]. Please contact [facility] at [phone] with questions or concerns regarding your invoice.   IF you received labwork today, you will receive an invoice from [facility]. Please contact [facility] at [phone] with questions or concerns regarding your invoice.   Our billing staff will not be able to assist you with questions regarding bills from these companies.  You will be contacted with the lab results as soon as they are available. The fastest way to get your results is to activate your My Chart account. Instructions are located on the last page of this  paperwork. If you have not heard from us regarding the results in 2 weeks, please contact this office.      

## 2020-08-13 NOTE — Progress Notes (Addendum)
11/18/202110:53 AM  Leslie Small 1986-08-12, 34 y.o., female [id number]  Chief Complaint  Patient presents with  . Medication Refill    fluoxetine, montelukast 90 day    HPI:   Patient is a 34 y.o. female with past medical history significant for asthma, allergies, depression and anxiety who presents today for routine follow-up.  Moderate persistent asthma without complication/Allergies Controlled. Continue current regime.  Flovent : continue to use daily Albuterol PRN: Uses this once per week Azelastine nasal spray: rarely uses flonase nasal spray: rarely uses Singulair 10mg  Takes daily Allergies not going well, with changing of seasons   Generalized anxiety disorder Controlled. Prozac daily 20mg .   Gyn exam: annual tomorrow Gyn managing paps last a year ago Will get labs at GYN    Depression screen [facility] 2/9 08/13/2020 02/10/2020 01/13/2020  Decreased Interest 0 0 0  Down, Depressed, Hopeless 0 0 0  PHQ - 2 Score 0 0 0  Altered sleeping - 1 -  Tired, decreased energy - 1 -  Change in appetite - 0 -  Feeling bad or failure about yourself  - 0 -  Trouble concentrating - 1 -  Moving slowly or fidgety/restless - 0 -  Suicidal thoughts - 0 -  PHQ-9 Score - 3 -  Difficult doing work/chores - Not difficult at all -    Fall Risk  08/13/2020 02/10/2020 01/13/2020 07/15/2019 01/14/2019  Falls in the past year? 0 0 0 0 0  Number falls in past yr: 0 0 0 0 0  Injury with Fall? 0 0 0 0 0  Follow up Falls evaluation completed - Falls evaluation completed Falls evaluation completed -     Allergies  Allergen Reactions  . Wellbutrin [Bupropion]     Prior to Admission medications   Medication Sig Start Date End Date Taking? Authorizing Provider  albuterol (VENTOLIN HFA) 108 (90 Base) MCG/ACT inhaler INHALE 2 PUFFS BY MOUTH EVERY 6 HOURS AS NEEDED FOR SHORTNESS OF BREATH 01/13/20  Yes 01/16/2019, MD  Azelastine HCl 137 MCG/SPRAY SOLN Place 2 sprays into the  nose 2 (two) times daily. 01/13/20  Yes Myles Lipps, MD  FLUoxetine (PROZAC) 20 MG tablet Take 1 tablet (20 mg total) by mouth daily. 02/10/20  Yes Myles Lipps, MD  fluticasone [facility]) 50 MCG/ACT nasal spray Place 1 spray into both nostrils 2 (two) times daily. 01/13/20  Yes [facility], MD  fluticasone (FLOVENT HFA) 110 MCG/ACT inhaler Inhale 1 puff into the lungs 2 (two) times daily. 01/13/20  Yes Myles Lipps, MD  folic acid (FOLVITE) 1 MG tablet  12/18/18  Yes [provider]  montelukast (SINGULAIR) 10 MG tablet Take 1 tablet (10 mg total) by mouth at bedtime. 04/06/20  Yes 12/20/18, MD    Past Medical History:  Diagnosis Date  . Anxiety   . Depression   . IBS (irritable bowel syndrome)   . Postpartum care following vaginal delivery (11/1) 07/27/2016    Past Surgical History:  Procedure Laterality Date  . DILATION AND CURETTAGE OF UTERUS     x 3  . WISDOM TOOTH EXTRACTION      Social History   Tobacco Use  . Smoking status: Former Smoker    Packs/day: 0.50    Types: Cigarettes  . Smokeless tobacco: Never Used  Substance Use Topics  . Alcohol use: Yes    Alcohol/week: 0.0 standard drinks    Comment: less than 1 per day  Family History  Problem Relation Age of Onset  . Breast cancer Paternal Grandmother   . Cervical cancer Cousin   . Prostate cancer Paternal Grandfather   . Colon polyps Paternal Grandfather   . Irritable bowel syndrome Father     Review of Systems  Constitutional: Negative for chills, fever and malaise/fatigue.  HENT: Positive for congestion. Negative for sinus pain and sore throat.   Respiratory: Negative for cough, sputum production, shortness of breath and wheezing.   Cardiovascular: Negative for chest pain, palpitations and leg swelling.  Gastrointestinal: Negative for heartburn.  Neurological: Negative for headaches.  Psychiatric/Behavioral: Negative for depression and suicidal ideas.      OBJECTIVE:  Today's Vitals   08/13/20 1030  BP: 125/83  Pulse: 95  Temp: 98.5 F (36.9 C)  SpO2: 98%  Weight: 198 lb (89.8 kg)  Height: 5\' 2"  (1.575 m)   Body mass index is 36.21 kg/m.   Physical Exam Constitutional:      General: She is not in acute distress.    Appearance: Normal appearance. She is not ill-appearing.  HENT:     Head: Normocephalic.     Mouth/Throat:     Mouth: Mucous membranes are moist.     Pharynx: Oropharynx is clear. No oropharyngeal exudate or posterior oropharyngeal erythema.  Cardiovascular:     Rate and Rhythm: Normal rate and regular rhythm.     Pulses: Normal pulses.     Heart sounds: Normal heart sounds. No murmur heard.  No friction rub. No gallop.   Pulmonary:     Effort: Pulmonary effort is normal. No respiratory distress.     Breath sounds: Normal breath sounds. No stridor. No wheezing, rhonchi or rales.  Abdominal:     General: Bowel sounds are normal.     Palpations: Abdomen is soft.     Tenderness: There is no abdominal tenderness.  Musculoskeletal:     Right lower leg: No edema.     Left lower leg: No edema.  Skin:    General: Skin is warm and dry.  Neurological:     Mental Status: She is alert and oriented to person, place, and time.  Psychiatric:        Mood and Affect: Mood normal.        Behavior: Behavior normal.     No results found for this or any previous visit (from the past 24 hour(s)).  No results found.   ASSESSMENT and PLAN  Problem List Items Addressed This Visit      Respiratory   Moderate persistent asthma without complication - Primary and Seasonal allergies   Relevant Medications   montelukast (SINGULAIR) 10 MG tablet Stable on Current Regiment Flovent : continue to use daily Albuterol PRN: Uses this once per week Azelastine nasal spray: rarely uses flonase nasal spray: rarely uses Singulair 10mg  Takes daily     Other   Generalized anxiety disorder   Relevant Medications    FLUoxetine (PROZAC) 20 MG tablet Stable on current regimen.  Will get labs at GYN.       Return in about 6 months (around 02/10/2021).    Silvino Selman, FNP-BC Primary [facility] at [facility] [address] [place], [address] [place].  [phone] Fax [phone]  I have reviewed and agree with above documentation. [phone], MD

## 2020-08-14 DIAGNOSIS — B977 Papillomavirus as the cause of diseases classified elsewhere: Secondary | ICD-10-CM | POA: Diagnosis not present

## 2020-08-14 DIAGNOSIS — Z6832 Body mass index (BMI) 32.0-32.9, adult: Secondary | ICD-10-CM | POA: Diagnosis not present

## 2020-08-14 DIAGNOSIS — Z1151 Encounter for screening for human papillomavirus (HPV): Secondary | ICD-10-CM | POA: Diagnosis not present

## 2020-08-14 DIAGNOSIS — Z01411 Encounter for gynecological examination (general) (routine) with abnormal findings: Secondary | ICD-10-CM | POA: Diagnosis not present

## 2020-08-14 DIAGNOSIS — Z01419 Encounter for gynecological examination (general) (routine) without abnormal findings: Secondary | ICD-10-CM | POA: Diagnosis not present

## 2020-08-14 DIAGNOSIS — R635 Abnormal weight gain: Secondary | ICD-10-CM | POA: Diagnosis not present

## 2020-08-14 DIAGNOSIS — Z7189 Other specified counseling: Secondary | ICD-10-CM | POA: Diagnosis not present

## 2020-08-27 DIAGNOSIS — Z713 Dietary counseling and surveillance: Secondary | ICD-10-CM | POA: Diagnosis not present

## 2020-08-27 DIAGNOSIS — Z6832 Body mass index (BMI) 32.0-32.9, adult: Secondary | ICD-10-CM | POA: Diagnosis not present

## 2020-09-30 DIAGNOSIS — Z6831 Body mass index (BMI) 31.0-31.9, adult: Secondary | ICD-10-CM | POA: Diagnosis not present

## 2020-09-30 DIAGNOSIS — Z713 Dietary counseling and surveillance: Secondary | ICD-10-CM | POA: Diagnosis not present

## 2020-12-30 DIAGNOSIS — Z713 Dietary counseling and surveillance: Secondary | ICD-10-CM | POA: Diagnosis not present

## 2020-12-30 DIAGNOSIS — Z6831 Body mass index (BMI) 31.0-31.9, adult: Secondary | ICD-10-CM | POA: Diagnosis not present

## 2020-12-30 DIAGNOSIS — E669 Obesity, unspecified: Secondary | ICD-10-CM | POA: Diagnosis not present

## 2021-01-27 DIAGNOSIS — Z6831 Body mass index (BMI) 31.0-31.9, adult: Secondary | ICD-10-CM | POA: Diagnosis not present

## 2021-01-27 DIAGNOSIS — Z713 Dietary counseling and surveillance: Secondary | ICD-10-CM | POA: Diagnosis not present

## 2021-02-10 ENCOUNTER — Ambulatory Visit: Payer: BC Managed Care – PPO | Admitting: Family Medicine

## 2021-03-09 DIAGNOSIS — Z713 Dietary counseling and surveillance: Secondary | ICD-10-CM | POA: Diagnosis not present

## 2021-03-09 DIAGNOSIS — Z6829 Body mass index (BMI) 29.0-29.9, adult: Secondary | ICD-10-CM | POA: Diagnosis not present

## 2021-06-23 DIAGNOSIS — Z713 Dietary counseling and surveillance: Secondary | ICD-10-CM | POA: Diagnosis not present

## 2021-06-23 DIAGNOSIS — Z6828 Body mass index (BMI) 28.0-28.9, adult: Secondary | ICD-10-CM | POA: Diagnosis not present

## 2021-08-16 DIAGNOSIS — Z01419 Encounter for gynecological examination (general) (routine) without abnormal findings: Secondary | ICD-10-CM | POA: Diagnosis not present

## 2021-08-16 DIAGNOSIS — Z6829 Body mass index (BMI) 29.0-29.9, adult: Secondary | ICD-10-CM | POA: Diagnosis not present

## 2021-11-29 DIAGNOSIS — N62 Hypertrophy of breast: Secondary | ICD-10-CM | POA: Diagnosis not present

## 2021-11-29 DIAGNOSIS — F411 Generalized anxiety disorder: Secondary | ICD-10-CM | POA: Diagnosis not present

## 2021-11-29 DIAGNOSIS — J4520 Mild intermittent asthma, uncomplicated: Secondary | ICD-10-CM | POA: Diagnosis not present

## 2021-11-29 DIAGNOSIS — F331 Major depressive disorder, recurrent, moderate: Secondary | ICD-10-CM | POA: Diagnosis not present

## 2021-11-30 DIAGNOSIS — Z6832 Body mass index (BMI) 32.0-32.9, adult: Secondary | ICD-10-CM | POA: Diagnosis not present

## 2021-11-30 DIAGNOSIS — Z1329 Encounter for screening for other suspected endocrine disorder: Secondary | ICD-10-CM | POA: Diagnosis not present

## 2021-11-30 DIAGNOSIS — Z713 Dietary counseling and surveillance: Secondary | ICD-10-CM | POA: Diagnosis not present

## 2021-11-30 DIAGNOSIS — R7401 Elevation of levels of liver transaminase levels: Secondary | ICD-10-CM | POA: Diagnosis not present

## 2021-12-30 DIAGNOSIS — R7401 Elevation of levels of liver transaminase levels: Secondary | ICD-10-CM | POA: Diagnosis not present

## 2021-12-30 DIAGNOSIS — Z6832 Body mass index (BMI) 32.0-32.9, adult: Secondary | ICD-10-CM | POA: Diagnosis not present

## 2021-12-30 DIAGNOSIS — Z713 Dietary counseling and surveillance: Secondary | ICD-10-CM | POA: Diagnosis not present

## 2022-08-01 DIAGNOSIS — F411 Generalized anxiety disorder: Secondary | ICD-10-CM | POA: Diagnosis not present

## 2022-08-01 DIAGNOSIS — Z23 Encounter for immunization: Secondary | ICD-10-CM | POA: Diagnosis not present

## 2022-08-01 DIAGNOSIS — Z0000 Encounter for general adult medical examination without abnormal findings: Secondary | ICD-10-CM | POA: Diagnosis not present

## 2022-08-01 DIAGNOSIS — J4520 Mild intermittent asthma, uncomplicated: Secondary | ICD-10-CM | POA: Diagnosis not present

## 2022-09-13 DIAGNOSIS — Z713 Dietary counseling and surveillance: Secondary | ICD-10-CM | POA: Diagnosis not present

## 2022-09-13 DIAGNOSIS — Z01419 Encounter for gynecological examination (general) (routine) without abnormal findings: Secondary | ICD-10-CM | POA: Diagnosis not present

## 2022-09-13 DIAGNOSIS — Z6824 Body mass index (BMI) 24.0-24.9, adult: Secondary | ICD-10-CM | POA: Diagnosis not present
